# Patient Record
Sex: Male | Born: 1990
Health system: Southern US, Community
[De-identification: ages and names within clinical notes are randomized; demographics above are authoritative.]

## PROBLEM LIST (undated history)

## (undated) DIAGNOSIS — F419 Anxiety disorder, unspecified: Secondary | ICD-10-CM

## (undated) DIAGNOSIS — F329 Major depressive disorder, single episode, unspecified: Secondary | ICD-10-CM

## (undated) DIAGNOSIS — T7840XA Allergy, unspecified, initial encounter: Secondary | ICD-10-CM

## (undated) DIAGNOSIS — F32A Depression, unspecified: Secondary | ICD-10-CM

## (undated) HISTORY — DX: Anxiety disorder, unspecified: F41.9

## (undated) HISTORY — DX: Depression, unspecified: F32.A

## (undated) HISTORY — DX: Major depressive disorder, single episode, unspecified: F32.9

## (undated) HISTORY — DX: Allergy, unspecified, initial encounter: T78.40XA

---
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
## 2009-12-07 ENCOUNTER — Ambulatory Visit: Payer: Self-pay | Admitting: Family Medicine

## 2009-12-07 DIAGNOSIS — I10 Essential (primary) hypertension: Secondary | ICD-10-CM | POA: Insufficient documentation

## 2009-12-07 DIAGNOSIS — J45909 Unspecified asthma, uncomplicated: Secondary | ICD-10-CM | POA: Insufficient documentation

## 2009-12-07 DIAGNOSIS — J019 Acute sinusitis, unspecified: Secondary | ICD-10-CM | POA: Insufficient documentation

## 2009-12-07 DIAGNOSIS — F418 Other specified anxiety disorders: Secondary | ICD-10-CM | POA: Insufficient documentation

## 2010-02-26 HISTORY — PX: TYMPANOSTOMY TUBE PLACEMENT: SHX32

## 2010-03-30 NOTE — Assessment & Plan Note (Signed)
Summary: BRAND NEW PT/TO EST/URI/CJR   Vital Signs:  Patient profile:   20 year old male Weight:      136 pounds O2 Sat:      98 % Temp:     98.6 degrees F Pulse rate:   98 / minute BP sitting:   120 / 80  (left arm) Cuff size:   regular  Vitals Entered By: Pura Spice, RN (December 07, 2009 2:18 PM) CC: congestion sinus  Is Patient Diabetic? No   History of Present Illness: 20 yr old male to establish with [place] and for a 3 day hx of stuffy head, HA, PND, chest congestion, and a dry cough. No fever. He also relates that he has been mildly depressed for several months, and he has contacted Dr. Dwan Bolt, a psychologist. He will begin some therapy with him next week. 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   Preventive Screening-Counseling & Management  Alcohol-Tobacco     Smoking Status: current     Smoking Cessation Counseling: YES  Allergies (verified): No Known Drug Allergies  Past History:  Past Medical History: Asthma as a child, resolved Depression, never on meds but has had therapy in the past  elevated BP, never treated  Past Surgical History: Denies surgical history  Family History: Reviewed history and no changes required. Family History of Alcoholism/Addiction Family History of CAD Male 1st degree relative <50 Family History Depression Family History Hypertension Family History of Prostate CA 1st degree relative <50  Social History: Reviewed history and no changes required. Single Current Smoker Alcohol use-yes Smoking Status:  current  Review of Systems  The patient denies anorexia, fever, weight loss, weight gain, vision loss, decreased hearing, hoarseness, chest pain, syncope, dyspnea on exertion, peripheral edema, hemoptysis, abdominal pain, melena, hematochezia, severe indigestion/heartburn, hematuria, incontinence, genital sores, muscle weakness, suspicious skin lesions, transient  blindness, difficulty walking, unusual weight change, abnormal bleeding, enlarged lymph nodes, angioedema, breast masses, and testicular masses.    Physical Exam  General:  Well-developed,well-nourished,in no acute distress; alert,appropriate and cooperative throughout examination Head:  Normocephalic and atraumatic without obvious abnormalities. No apparent alopecia or balding. Eyes:  No corneal or conjunctival inflammation noted. EOMI. Perrla. Funduscopic exam benign, without hemorrhages, exudates or papilledema. Vision grossly normal. Ears:  External ear exam shows no significant lesions or deformities.  Otoscopic examination reveals clear canals, tympanic membranes are intact bilaterally without bulging, retraction, inflammation or discharge. Hearing is grossly normal bilaterally. Nose:  External nasal examination shows no deformity or inflammation. Nasal mucosa are pink and moist without lesions or exudates. Mouth:  Oral mucosa and oropharynx without lesions or exudates.  Teeth in good repair. Neck:  No deformities, masses, or tenderness noted. Chest Wall:  No deformities, masses, tenderness or gynecomastia noted. Lungs:  soft end expiratory wheezes Heart:  Normal rate and regular rhythm. S1 and S2 normal without gallop, murmur, click, rub or other extra sounds. Psych:  Cognition and judgment appear intact. Alert and cooperative with normal attention span and concentration. No apparent delusions, illusions, hallucinations   Impression & Recommendations:  Problem # 1:  ACUTE SINUSITIS, UNSPECIFIED (ICD-461.9)  His updated medication list for this problem includes:    Zithromax Z-pak 250 Mg Tabs (Azithromycin) .Marland Kitchen... As directed  Problem # 2:  DEPRESSION (ICD-311)  Complete Medication List: 1)  Zithromax Z-pak 250 Mg Tabs (Azithromycin) .... As directed  Patient Instructions: 1)  Tobacco  is very bad for your health and your loved ones ! You should stop smoking !  2)  Stop smoking  tips: Choose a quit date. Cut down before the quit date. Decide what you will do as a substitute when you feel the urge to smoke(gum, toothpick, exercise).  3)  Use a Zpack. 4)  follow up with therapy as above  Prescriptions: ZITHROMAX Z-PAK 250 MG TABS (AZITHROMYCIN) as directed  #1 x 0   Entered and Authorized by:   Nelwyn Salisbury MD   Signed by:   Nelwyn Salisbury MD on 12/07/2009   Method used:   Electronically to        [facility]. [phone]* (retail)       [id number]       [place], [place]  [address]       Ph: [phone]       Fax: [phone]   RxID:   [id number]

## 2010-06-14 ENCOUNTER — Ambulatory Visit (INDEPENDENT_AMBULATORY_CARE_PROVIDER_SITE_OTHER): Payer: 59 | Admitting: Family Medicine

## 2010-06-14 ENCOUNTER — Encounter: Payer: Self-pay | Admitting: Family Medicine

## 2010-06-14 VITALS — BP 120/78 | Temp 98.6°F | Ht 69.0 in | Wt 130.0 lb

## 2010-06-14 DIAGNOSIS — A088 Other specified intestinal infections: Secondary | ICD-10-CM

## 2010-06-14 DIAGNOSIS — M545 Low back pain, unspecified: Secondary | ICD-10-CM

## 2010-06-14 DIAGNOSIS — A084 Viral intestinal infection, unspecified: Secondary | ICD-10-CM

## 2010-06-14 NOTE — Progress Notes (Signed)
  Subjective:    Patient ID: Joseph Evans, male    DOB: Apr 05, 1990, 20 y.o.   MRN: [id number]  HPI Patient seen for the following  Onset this past Monday 2 days ago fever, chills, body aches, vomiting, and nonbloody diarrhea. Diarrhea seems to be slowly improving today. Still has poor appetite but no vomiting thus far today. He felt improved last night until he ate some pizza which seemed to have re- triggered his symptoms. Has not tried any Imodium. No ill contacts. He is keeping down fluids today. He has lost a few pounds this week with his illness.  Patient complains of almost 1 year of back pain mostly lower thoracic region. Initially noted after doing some lifting and twisting type injury he worked another job. No radiculopathy symptoms. Pain is somewhat intermittent. Worse with bending and lifting. Pain is mostly lower thoracic and upper lumbar region.  No numbness or weakness. No alleviating factors. No appetite or weight changes.   Review of Systems  Constitutional: Positive for fever and chills. Negative for activity change.  HENT: Negative for sore throat and trouble swallowing.   Respiratory: Negative for cough and shortness of breath.   Cardiovascular: Negative for chest pain and palpitations.  Gastrointestinal: Positive for nausea, vomiting and diarrhea. Negative for blood in stool.  Genitourinary: Negative for dysuria.  Skin: Negative for rash.  Neurological: Negative for dizziness, weakness and headaches.  Hematological: Negative for adenopathy. Does not bruise/bleed easily.       Objective:   Physical Exam  Constitutional: He is oriented to person, place, and time. He appears well-developed and well-nourished.  HENT:  Head: Normocephalic.  Right Ear: External ear normal.  Left Ear: External ear normal.  Mouth/Throat: Oropharynx is clear and moist. No oropharyngeal exudate.  Neck: Neck supple.  Cardiovascular: Normal rate and regular rhythm.   No murmur  heard. Pulmonary/Chest: Effort normal and breath sounds normal. He has no wheezes. He has no rales.  Abdominal: Soft. Bowel sounds are normal. He exhibits no distension. There is no tenderness. There is no rebound and no guarding.  Musculoskeletal: He exhibits no edema.       Straight leg raises are negative bilaterally. He has some nonspecific muscular tenderness lower thoracic region. No spinal tenderness. Full range of motion back  Lymphadenopathy:    He has no cervical adenopathy.  Neurological: He is alert and oriented to person, place, and time. He has normal reflexes.       Full strength with plantar and dorsi flexion bilaterally  Skin: No rash noted.          Assessment & Plan:  #1 probable viral gastroenteritis. Reviewed appropriate diet. Try over-the-counter Imodium as needed for diarrhea. No antinausea meds needed at this time. Were not written for today. Suspect this will continue to resolve. #2 low back pain mostly lower thoracic region. Suspect muscular. This has gone on for several months. Set up physical therapy. If no improvement to 3 weeks consider back films and followup with primary physician.

## 2010-06-14 NOTE — Patient Instructions (Signed)
Diarrhea Diarrhea can be caused by many conditions. The most common cause is a virus.  Other causes include:  Food poisoning.   Bacterial infection.   Reactions to medicine (especially antibiotics and antacids).  Most of the time diarrhea improves after 2-3 days of rest and oral fluid replacement.  Drink enough clear fluids (water, sodas, Gatorade) to prevent dehydration. Adults must drink at least 2-3 quarts daily. Solid foods and dairy products should be avoided until you improve. Then begin with bland foods such as bananas, rice, applesauce, dry toast, crackers or other starches. Avoid spicy or fatty foods, caffeine and alcohol for several days. Medicine to control cramping and diarrhea may be helpful. If you have a fever or blood in the stool, avoid these medicines. They may prolong your illness. Antibiotics can speed recovery from diarrhea due to some bacterial infections, but may cause complications.  SEEK MEDICAL CARE IF:  Your diarrhea does not get better in 3 days.   You have fever.   You have blood in the stool.   You develop vomiting.   You become more dehydrated.  Document Released: 02/12/2005 Document Re-Released: 08/26/2006 [employer] Patient Information 2011 [employer]. 

## 2010-10-23 ENCOUNTER — Ambulatory Visit (INDEPENDENT_AMBULATORY_CARE_PROVIDER_SITE_OTHER): Payer: 59 | Admitting: Family Medicine

## 2010-10-23 ENCOUNTER — Encounter: Payer: Self-pay | Admitting: Family Medicine

## 2010-10-23 VITALS — BP 118/62 | HR 65 | Temp 98.5°F | Wt 142.0 lb

## 2010-10-23 DIAGNOSIS — B079 Viral wart, unspecified: Secondary | ICD-10-CM

## 2010-10-23 NOTE — Progress Notes (Signed)
  Subjective:    Patient ID: Joseph Evans, male    DOB: 1990-06-21, 21 y.o.   MRN: [id number]  HPI Here for 2 months of a wart growing on the right 5th finger. It is not painful. OTC measures have not worked.    Review of Systems  Constitutional: Negative.        Objective:   Physical Exam  Constitutional: He appears well-developed and well-nourished.  Skin:       Flat wart on right 5th finger near the nail           Assessment & Plan:  Treated with cryotherapy. Recheck prn

## 2010-11-14 ENCOUNTER — Ambulatory Visit (INDEPENDENT_AMBULATORY_CARE_PROVIDER_SITE_OTHER): Payer: 59 | Admitting: Family Medicine

## 2010-11-14 DIAGNOSIS — J302 Other seasonal allergic rhinitis: Secondary | ICD-10-CM

## 2010-11-14 DIAGNOSIS — J309 Allergic rhinitis, unspecified: Secondary | ICD-10-CM

## 2010-11-28 ENCOUNTER — Ambulatory Visit (INDEPENDENT_AMBULATORY_CARE_PROVIDER_SITE_OTHER): Payer: 59 | Admitting: Family Medicine

## 2010-11-28 ENCOUNTER — Encounter: Payer: Self-pay | Admitting: Family Medicine

## 2010-11-28 VITALS — BP 128/76 | HR 84 | Temp 99.2°F | Wt 144.0 lb

## 2010-11-28 DIAGNOSIS — J329 Chronic sinusitis, unspecified: Secondary | ICD-10-CM

## 2010-11-28 MED ORDER — AMOXICILLIN-POT CLAVULANATE 875-125 MG PO TABS: 1.0000 | ORAL_TABLET | Freq: Two times a day (BID) | ORAL | Status: AC

## 2010-11-29 ENCOUNTER — Encounter: Payer: Self-pay | Admitting: Family Medicine

## 2010-11-29 NOTE — Progress Notes (Signed)
  Subjective:    Patient ID: Joseph Evans, male    DOB: [birth year], 20 y.o.   MRN: [id number]  HPI Here for one week of sinus pressure, PND, ST, and a dry cough. No fever   Review of Systems  Constitutional: Negative.   HENT: Positive for congestion, postnasal drip and sinus pressure.   Eyes: Negative.   Respiratory: Positive for cough.        Objective:   Physical Exam  Constitutional: He appears well-developed and well-nourished.  HENT:  Right Ear: External ear normal.  Left Ear: External ear normal.  Nose: Nose normal.  Mouth/Throat: Oropharynx is clear and moist. No oropharyngeal exudate.  Eyes: Conjunctivae are normal. Pupils are equal, round, and reactive to light.  Neck: No thyromegaly present.  Pulmonary/Chest: Effort normal and breath sounds normal.  Lymphadenopathy:    He has no cervical adenopathy.          Assessment & Plan:  Rest, fluids, Mucinex

## 2010-12-30 NOTE — Progress Notes (Signed)
System Downtime Recovery The EMR experienced a system downtime.  This downtime occurred on 11-14-2010. During this downtime paper charting was completed by the provider.  The visit was documented on paper during the downtime and will be scanned into [facility]pic, billing was completed by the [facility] Billing Department .  The visit is being closed on behalf of the provider. 

## 2011-09-24 ENCOUNTER — Ambulatory Visit (INDEPENDENT_AMBULATORY_CARE_PROVIDER_SITE_OTHER): Payer: 59 | Admitting: Family Medicine

## 2011-09-24 ENCOUNTER — Encounter: Payer: Self-pay | Admitting: Family Medicine

## 2011-09-24 VITALS — BP 126/84 | HR 90 | Temp 99.4°F | Wt 132.0 lb

## 2011-09-24 DIAGNOSIS — J329 Chronic sinusitis, unspecified: Secondary | ICD-10-CM

## 2011-09-24 MED ORDER — LEVOFLOXACIN 500 MG PO TABS: 500.0000 mg | ORAL_TABLET | Freq: Every day | ORAL | Status: AC

## 2011-09-24 NOTE — Progress Notes (Signed)
  Subjective:    Patient ID: Joseph Evans, male    DOB: December 01, 1990, 21 y.o.   MRN: [id number]  HPI Here for 3 days of fever, HA, sinus pressure, PND, and ST.    Review of Systems  Constitutional: Positive for fever.  HENT: Positive for congestion and postnasal drip.   Eyes: Negative.   Respiratory: Positive for cough.        Objective:   Physical Exam  Constitutional: He appears well-developed and well-nourished.  HENT:  Right Ear: External ear normal.  Nose: Nose normal.  Mouth/Throat: Oropharynx is clear and moist. No oropharyngeal exudate.       Left PE tube is intact   Eyes: Conjunctivae are normal.  Neck: No thyromegaly present.  Pulmonary/Chest: Effort normal and breath sounds normal.  Lymphadenopathy:    He has no cervical adenopathy.          Assessment & Plan:  Written out of work today.

## 2011-09-26 ENCOUNTER — Telehealth: Payer: Self-pay | Admitting: Family Medicine

## 2011-09-26 NOTE — Telephone Encounter (Signed)
Caller: Joseph Evans/Patient is calling with a question about Abx.The medication was written by Nelwyn Salisbury  On Levaquin since 09/24/11. States there are others around him with sx similar to what he is being treated for (sinus infection) and he questions if his illness is viral and therefore does not need to complete the Rx antibiotic - he has not started the medication.   He also asks if it is advisable for him to take the atbx since he has had several courses of medication in the past year.  Emergent sx ruled out.  Note to provider for follow up.  Medication Questions protocol used.

## 2011-09-27 NOTE — Telephone Encounter (Signed)
This is up to him. It has been 3 days now since I saw him. If he still has the same symptoms, then YES he needs to take the antibiotic. If he is feeling better, then he could hold off and see if it resolves.

## 2011-09-28 NOTE — Telephone Encounter (Signed)
I spoke with pt and he is feeling better.

## 2011-12-13 ENCOUNTER — Other Ambulatory Visit: Payer: 59

## 2011-12-20 ENCOUNTER — Encounter: Payer: 59 | Admitting: Family Medicine

## 2012-01-04 ENCOUNTER — Encounter: Payer: 59 | Admitting: Family Medicine

## 2012-01-11 ENCOUNTER — Other Ambulatory Visit: Payer: 59

## 2012-01-18 ENCOUNTER — Encounter: Payer: 59 | Admitting: Family Medicine

## 2012-03-06 ENCOUNTER — Ambulatory Visit (INDEPENDENT_AMBULATORY_CARE_PROVIDER_SITE_OTHER): Payer: 59 | Admitting: Family Medicine

## 2012-03-06 ENCOUNTER — Encounter: Payer: Self-pay | Admitting: Family Medicine

## 2012-03-06 VITALS — BP 132/78 | HR 97 | Temp 98.7°F | Wt 131.0 lb

## 2012-03-06 DIAGNOSIS — J069 Acute upper respiratory infection, unspecified: Secondary | ICD-10-CM

## 2012-03-06 NOTE — Progress Notes (Signed)
Chief Complaint  Patient presents with  . Sinus Problem    fatgiue, achy body, sinus pressure, cough, congestion x monday     HPI:  -started: 3 days ago -symptoms:nasal congestion, sore throat, cough, body aches, some sinus pressure -denies:fever, SOB, NVD, tooth pain, ear pain - strep or mono exposure -has tried: nothing -sick contacts: co-worker sick -Hx of: hx of tubes in ears   ROS: See pertinent positives and negatives per HPI.  Past Medical History  Diagnosis Date  . Asthma   . Depression     Family History  Problem Relation Age of Onset  . Heart disease Mother   . Hypertension Mother   . Mental illness Mother   . Heart disease Father   . Hypertension Father   . Mental illness Father   . Heart disease Maternal Grandmother   . Hypertension Maternal Grandmother   . Mental illness Maternal Grandmother   . Cancer Maternal Grandfather     prostate  . Heart disease Maternal Grandfather   . Hypertension Maternal Grandfather   . Mental illness Maternal Grandfather   . Heart disease Paternal Grandmother   . Hypertension Paternal Grandmother   . Mental illness Paternal Grandmother   . Cancer Paternal Grandfather     prostate  . Heart disease Paternal Grandfather   . Hypertension Paternal Grandfather   . Mental illness Paternal Grandfather   . Alcohol abuse Other     History   Social History  . Marital Status: Single    Spouse Name: N/A    Number of Children: N/A  . Years of Education: N/A   Social History Main Topics  . Smoking status: Current Every Day Smoker -- 0.5 packs/day for 12 years    Types: Cigarettes  . Smokeless tobacco: Never Used  . Alcohol Use: 3.5 oz/week    7 drink(s) per week  . Drug Use: No  . Sexually Active: None   Other Topics Concern  . None   Social History Narrative   Occupation:  [employer]SingleAlcohol use: yesCurrent SmokerRecreational Drug use:  "smokes weed occ"    Current outpatient prescriptions:fluticasone  (FLONASE) 50 MCG/ACT nasal spray, Place 2 sprays into the nose daily.  , Disp: , Rfl:   EXAM:  Filed Vitals:   03/06/12 1502  BP: 132/78  Pulse: 97  Temp: 98.7 F (37.1 C)    There is no height on file to calculate BMI.  GENERAL: vitals reviewed and listed above, alert, oriented, appears well hydrated and in no acute distress  HEENT: atraumatic, conjunttiva clear, no obvious abnormalities on inspection of external nose and ears, tube in place L TM, both ear canals normal, TMs clear, clear nasal congestion, mild erythema of post oropharynx with PND  NECK: no obvious masses on inspection  LUNGS: clear to auscultation bilaterally, no wheezes, rales or rhonchi, good air movement  CV: HRRR, no peripheral edema  MS: moves all extremities without noticeable abnormality  PSYCH: pleasant and cooperative, no obvious depression or anxiety  ASSESSMENT AND PLAN:  Discussed the following assessment and plan:  1. Upper respiratory infection    -likely viral, supportive care and return precartions -Patient advised to return or notify a doctor immediately if symptoms worsen or persist or new concerns arise.  Patient Instructions  INSTRUCTIONS FOR UPPER RESPIRATORY INFECTION:  -plenty of rest and fluids  -nasal saline wash 2-3 times daily (use prepackaged nasal saline or bottled/distilled water if making your own)   -can use sinex nasal spray for  drainage and nasal congestion - but do NOT use longer then 3-4 days  -can use tylenol or ibuprofen as directed for aches and sorethroat  -in the winter time, using a humidifier at night is helpful (please follow cleaning instructions)  -if you are taking a cough medication - use only as directed, may also try a teaspoon of honey to coat the throat and throat lozenges  -for sore throat, salt water gargles can help  -follow up if you have fevers, facial pain, tooth pain, difficulty breathing or are worsening or not getting better in 5-7  days      KIM, Dahlia Client R.

## 2012-03-06 NOTE — Patient Instructions (Signed)

## 2012-11-08 ENCOUNTER — Ambulatory Visit (INDEPENDENT_AMBULATORY_CARE_PROVIDER_SITE_OTHER): Payer: 59 | Admitting: Physician Assistant

## 2012-11-08 VITALS — BP 120/80 | HR 83 | Temp 99.3°F | Resp 16 | Ht 71.0 in | Wt 135.0 lb

## 2012-11-08 DIAGNOSIS — J069 Acute upper respiratory infection, unspecified: Secondary | ICD-10-CM

## 2012-11-08 MED ORDER — PSEUDOEPHEDRINE HCL 60 MG PO TABS: 60.0000 mg | ORAL_TABLET | Freq: Four times a day (QID) | ORAL | Status: DC | PRN

## 2012-11-08 MED ORDER — CETIRIZINE HCL 10 MG PO TABS: 10.0000 mg | ORAL_TABLET | Freq: Every day | ORAL | Status: DC

## 2012-11-08 MED ORDER — IPRATROPIUM BROMIDE 0.03 % NA SOLN: 2.0000 | Freq: Two times a day (BID) | NASAL | Status: DC

## 2012-11-08 MED ORDER — BENZONATATE 100 MG PO CAPS: 100.0000 mg | ORAL_CAPSULE | Freq: Three times a day (TID) | ORAL | Status: DC | PRN

## 2012-11-08 NOTE — Patient Instructions (Addendum)
Take the cetirizine once daily - this will help dry up nasal congestion.  Atrovent (ipratropium) nasal spray 2-3 times per day to help with runny nose and post-nasal drainage.  Pseudoephedrine every 6 hours for the next 2-3 days to relieve congestion  Tessalon Perles (benzonatate) every 8 hours if needed for cough.  Drink plenty of fluids (water is best!) and get plenty of rest.  Let us know if any symptoms are worsening or not improving.   Upper Respiratory Infection, Adult An upper respiratory infection (URI) is also sometimes known as the common cold. The upper respiratory tract includes the nose, sinuses, throat, trachea, and bronchi. Bronchi are the airways leading to the lungs. Most people improve within 1 week, but symptoms can last up to 2 weeks. A residual cough may last even longer.  CAUSES Many different viruses can infect the tissues lining the upper respiratory tract. The tissues become irritated and inflamed and often become very moist. Mucus production is also common. A cold is contagious. You can easily spread the virus to others by oral contact. This includes kissing, sharing a glass, coughing, or sneezing. Touching your mouth or nose and then touching a surface, which is then touched by another person, can also spread the virus. SYMPTOMS  Symptoms typically develop 1 to 3 days after you come in contact with a cold virus. Symptoms vary from person to person. They may include:  Runny nose.  Sneezing.  Nasal congestion.  Sinus irritation.  Sore throat.  Loss of voice (laryngitis).  Cough.  Fatigue.  Muscle aches.  Loss of appetite.  Headache.  Low-grade fever. DIAGNOSIS  You might diagnose your own cold based on familiar symptoms, since most people get a cold 2 to 3 times a year. Your caregiver can confirm this based on your exam. Most importantly, your caregiver can check that your symptoms are not due to another disease such as strep throat, sinusitis,  pneumonia, asthma, or epiglottitis. Blood tests, throat tests, and X-rays are not necessary to diagnose a common cold, but they may sometimes be helpful in excluding other more serious diseases. Your caregiver will decide if any further tests are required. RISKS AND COMPLICATIONS  You may be at risk for a more severe case of the common cold if you smoke cigarettes, have chronic heart disease (such as heart failure) or lung disease (such as asthma), or if you have a weakened immune system. The very young and very old are also at risk for more serious infections. Bacterial sinusitis, middle ear infections, and bacterial pneumonia can complicate the common cold. The common cold can worsen asthma and chronic obstructive pulmonary disease (COPD). Sometimes, these complications can require emergency medical care and may be life-threatening. PREVENTION  The best way to protect against getting a cold is to practice good hygiene. Avoid oral or hand contact with people with cold symptoms. Wash your hands often if contact occurs. There is no clear evidence that vitamin C, vitamin E, echinacea, or exercise reduces the chance of developing a cold. However, it is always recommended to get plenty of rest and practice good nutrition. TREATMENT  Treatment is directed at relieving symptoms. There is no cure. Antibiotics are not effective, because the infection is caused by a virus, not by bacteria. Treatment may include:  Increased fluid intake. Sports drinks offer valuable electrolytes, sugars, and fluids.  Breathing heated mist or steam (vaporizer or shower).  Eating chicken soup or other clear broths, and maintaining good nutrition.  Getting plenty of  rest.  Using gargles or lozenges for comfort.  Controlling fevers with ibuprofen or acetaminophen as directed by your caregiver.  Increasing usage of your inhaler if you have asthma. Zinc gel and zinc lozenges, taken in the first 24 hours of the common cold, can  shorten the duration and lessen the severity of symptoms. Pain medicines may help with fever, muscle aches, and throat pain. A variety of non-prescription medicines are available to treat congestion and runny nose. Your caregiver can make recommendations and may suggest nasal or lung inhalers for other symptoms.  HOME CARE INSTRUCTIONS   Only take over-the-counter or prescription medicines for pain, discomfort, or fever as directed by your caregiver.  Use a warm mist humidifier or inhale steam from a shower to increase air moisture. This may keep secretions moist and make it easier to breathe.  Drink enough water and fluids to keep your urine clear or pale yellow.  Rest as needed.  Return to work when your temperature has returned to normal or as your caregiver advises. You may need to stay home longer to avoid infecting others. You can also use a face mask and careful hand washing to prevent spread of the virus. SEEK MEDICAL CARE IF:   After the first few days, you feel you are getting worse rather than better.  You need your caregiver's advice about medicines to control symptoms.  You develop chills, worsening shortness of breath, or brown or red sputum. These may be signs of pneumonia.  You develop yellow or brown nasal discharge or pain in the face, especially when you bend forward. These may be signs of sinusitis.  You develop a fever, swollen neck glands, pain with swallowing, or white areas in the back of your throat. These may be signs of strep throat. SEEK IMMEDIATE MEDICAL CARE IF:   You have a fever.  You develop severe or persistent headache, ear pain, sinus pain, or chest pain.  You develop wheezing, a prolonged cough, cough up blood, or have a change in your usual mucus (if you have chronic lung disease).  You develop sore muscles or a stiff neck. Document Released: 08/08/2000 Document Revised: 05/07/2011 Document Reviewed: 06/16/2010 [facility] Patient Information 2014  [place], [place].

## 2012-11-08 NOTE — Progress Notes (Signed)
  Subjective:    Patient ID: Joseph Evans, male    DOB: 24-Nov-1990, 22 y.o.   MRN: [id number]   HPI   Mr. Joseph Evans is a pleasant 22 yr old male here with concern for illness.  Reports he is "painfully stuffed up", "stuffed up beyond belief."  Nasal drainage - "viscous green mucus."  +sore throat.  Subjective fever and chills though hasn't taken his temp.  +coughing, +sneezing.  All symptoms started yesterday, worse today.  One episode of emesis today - denies nausea. Girlfriend had similar symptoms recently, though not as bad.  He has not used anything for symptoms, has been trying to increase fluid intake.   Review of Systems  Constitutional: Positive for fever (subjective) and chills (subjective).  HENT: Positive for ear pain, congestion, sore throat, rhinorrhea, sneezing and postnasal drip.   Respiratory: Positive for cough. Negative for shortness of breath and wheezing.   Cardiovascular: Negative.   Gastrointestinal: Positive for vomiting. Negative for nausea and abdominal distention.  Musculoskeletal: Negative.   Skin: Negative.   Neurological: Negative.        Objective:   Physical Exam  Vitals reviewed. Constitutional: He is oriented to person, place, and time. He appears well-developed and well-nourished. No distress.  HENT:  Head: Normocephalic and atraumatic.  Right Ear: Tympanic membrane and ear canal normal.  Left Ear: Tympanic membrane and ear canal normal.  Nose: Mucosal edema and rhinorrhea present. Right sinus exhibits no maxillary sinus tenderness and no frontal sinus tenderness. Left sinus exhibits no maxillary sinus tenderness and no frontal sinus tenderness.  Mouth/Throat: Uvula is midline, oropharynx is clear and moist and mucous membranes are normal.  Eyes: Conjunctivae are normal. No scleral icterus.  Neck: Neck supple.  Cardiovascular: Normal rate, regular rhythm and normal heart sounds.   Pulmonary/Chest: Effort normal and breath sounds normal. He has no  wheezes. He has no rales.  Lymphadenopathy:    He has no cervical adenopathy.  Neurological: He is alert and oriented to person, place, and time.  Skin: Skin is warm and dry.  Psychiatric: He has a normal mood and affect. His behavior is normal.        Assessment & Plan:  Viral URI - Plan: ipratropium (ATROVENT) 0.03 % nasal spray, benzonatate (TESSALON) 100 MG capsule, cetirizine (ZYRTEC) 10 MG tablet, pseudoephedrine (SUDAFED) 60 MG tablet   [name] is a 22 yr old male here with 2 days of URI symptoms.  Suspect viral etiology.  He is afebrile, VSS, lungs CTA.  Will treat symptoms with Atrovent, Tessalon, Zyrtec, Sudafed.  Push fluids, rest.  Work note provided.  RTC if worsening or not improving.

## 2013-11-04 ENCOUNTER — Ambulatory Visit (INDEPENDENT_AMBULATORY_CARE_PROVIDER_SITE_OTHER): Payer: 59 | Admitting: Podiatrist

## 2013-11-04 ENCOUNTER — Encounter: Payer: Self-pay | Admitting: Podiatrist

## 2013-11-04 DIAGNOSIS — L03039 Cellulitis of unspecified toe: Secondary | ICD-10-CM

## 2013-11-04 NOTE — Patient Instructions (Signed)
Ingrown Toenail An ingrown toenail occurs when the sharp edge of a toenail grows into the skin of the groove beside the nail (lateral nail groove), causing pain. Ingrown toenails most commonly occur on the first (big) toe. If left untreated, an ingrown toenail may become inflamed and infected. The infection can even spread throughout the toe. SYMPTOMS   Pain, centered on the nail groove.  Tenderness and inflammation.  Pus drainage (occasionally).  Signs of infection: redness, swelling, pain, warm to the touch. CAUSES  Ingrown toenails are caused when the toenail grows into the neighboring fleshy nail fold. This may be the result of poor nail trimming or pressure from shoes.  RISK INCREASES WITH:   Curved nail formations.  Clipping toenails too far on the sides, which allows tissue to grow over the nail.  Poorly fitted shoes, especially those that press down on the toenails.  Sports that require sudden stops, causing the toe to jam into the shoe. PREVENTION   Trim toenails properly, making sure the sides are not clipped too short.  Wear properly fitted shoes.  Avoid excessive pressure on the toenails. PROGNOSIS  Ingrown toenails are usually curable within 1 week, depending on the presence or absence of an infection.  RELATED COMPLICATIONS   Chronic infection, that cannot be cured without surgery.  Spread of infection throughout the toe, or even the bone. TREATMENT  Treatment first involves resting from aggravating activities, wearing shoes that do not place pressure on the toenails, antibiotics (if infected), and soaking the foot in warm water (with or without Epsom salt). After the foot has been soaked, you may be able to gently lift the nail from the fleshy tissue, and place a bit of cotton ball under the nail, easing the pressure. If you have been prescribed antibiotics, expect relief to begin up to 48 hours after taking the medicine. Symptoms usually go away within 1 week. For  people who suffer from persistent (chronic) ingrown toenails, surgery may be advised. MEDICATION   If pain medicine is needed, nonsteroidal anti-inflammatory medicines (aspirin and ibuprofen), or other minor pain relievers (acetaminophen), are often advised.  Do not take pain medicine for 7 days before surgery.  Stronger pain relievers may be prescribed, if your caregiver thinks they are needed. Use only as directed and only as much as you need.  Antibiotics may be prescribed to fight infection. Take as directed by your caregiver. Finish the entire prescription, even if you start to feel better before you finish it. SOAKS AND COLD THERAPY   Soak the toe (or whole foot) for 20 minutes, twice a day, in a gallon of warm water. You may add 2 tablespoons of Epsom salts or a mild detergent.  Cold treatment (icing) should be applied for 10 to 15 minutes every 2 to 3 hours for inflammation and pain, and immediately after activity that aggravates your symptoms. Use ice packs or an ice massage. SEEK MEDICAL CARE IF:   Symptoms get worse or do not improve in 48 hours, despite treatment.  You develop fever, increased pain and swelling, or signs of infection (pain, redness, tenderness, swelling, warmth) in the toe, after surgery.  New, unexplained symptoms develop. (Drugs used in treatment may produce side effects.) Document Released: 02/12/2005 Document Revised: 05/07/2011 Document Reviewed: 05/27/2008 Exit[employer] Patient Information 2015 [employer]. This information is not intended to replace advice given to you by your health care provider. Make sure you discuss any questions you have with your health care provider.  

## 2013-11-04 NOTE — Progress Notes (Signed)
   Subjective:    Patient ID: Joseph Evans, male    DOB: August 23, 1990, 23 y.o.   MRN: [id number]  HPI  PT STATED INJURED LT FOOT GREAT TOENAIL IS BEEN HURTING FOR 1 MONTH. THE TOENAIL IS BEEN THE SAME. THE TOE GET AGGRAVATED BY PRESSURE. TRIED NO TREATMENT.    Review of Systems  All other systems reviewed and are negative.      Objective:   Physical Exam Patient is awake, alert, and oriented x 3.  In no acute distress.  Vascular status is intact with palpable pedal pulses at 2/4 DP and PT bilateral and capillary refill time within normal limits. Neurological sensation is also intact bilaterally via Semmes Weinstein monofilament at 5/5 sites. Light touch, vibratory sensation, Achilles tendon reflex is intact. Dermatological exam reveals skin color, turger and texture as normal. No open lesions present.  Musculature intact with dorsiflexion, plantarflexion, inversion, eversion.  left hallux nail distal tuft is grown into the distal tip of the toe.  Mild redness is present and no other signs of infection are present.       Assessment & Plan:  Contusion, paronychia left first  Plan:  Recommended a nonpermanent avulsion of the nail plate in order to allow a new nail to grow back out so he can train it not go into the skin. He would like to consider having the procedure and will be rescheduled for next week at his convenience. In the meantime he will try soaking and taping down the distal tip of the toe in order to take pressure off of the nail.

## 2013-11-11 ENCOUNTER — Ambulatory Visit (HOSPITAL_COMMUNITY)
Admission: RE | Admit: 2013-11-11 | Discharge: 2013-11-11 | Disposition: A | Discharge status: Home / Self Care | Payer: Worker's Compensation | Source: Ambulatory Visit | Attending: Family Medicine | Admitting: Family Medicine

## 2013-11-11 ENCOUNTER — Ambulatory Visit: Payer: 59

## 2013-11-11 ENCOUNTER — Other Ambulatory Visit: Payer: Self-pay | Admitting: Family Medicine

## 2013-11-11 DIAGNOSIS — S0093XA Contusion of unspecified part of head, initial encounter: Secondary | ICD-10-CM

## 2013-11-11 DIAGNOSIS — S0003XA Contusion of scalp, initial encounter: Secondary | ICD-10-CM | POA: Insufficient documentation

## 2013-11-11 DIAGNOSIS — X58XXXA Exposure to other specified factors, initial encounter: Secondary | ICD-10-CM | POA: Diagnosis not present

## 2013-11-11 DIAGNOSIS — R51 Headache: Secondary | ICD-10-CM | POA: Insufficient documentation

## 2013-11-11 DIAGNOSIS — R42 Dizziness and giddiness: Secondary | ICD-10-CM | POA: Diagnosis not present

## 2013-11-11 DIAGNOSIS — S1093XA Contusion of unspecified part of neck, initial encounter: Secondary | ICD-10-CM | POA: Diagnosis present

## 2013-11-11 DIAGNOSIS — S0083XA Contusion of other part of head, initial encounter: Principal | ICD-10-CM | POA: Insufficient documentation

## 2013-11-13 ENCOUNTER — Telehealth: Payer: Self-pay | Admitting: *Deleted

## 2013-11-13 ENCOUNTER — Ambulatory Visit: Payer: 59 | Admitting: Podiatrist

## 2013-11-13 NOTE — Telephone Encounter (Signed)
I had an appointment today at 3pm and I'm actually coming next Friday at 2:30pm.  I just want to verify, I'm actually moving into a new place Friday as well.  I just want to make sure I will be able to walk and take care of normal functions.  Give me a call.  I'd definitely appreciate a call back thank you.  I called and informed him that his toe will be sore, may get irritated from shoe while moving due to the absence of the toenail.  Toenail will not be there to protect it.  He stated, "I see what you mean."  He asked if he could do it the week after but he has to detail some cars.  I explained to him that depends upon him due to the same reason.  He stated he would suck it up.  I transferred him to a scheduler to reschedule his appointment.

## 2013-11-20 ENCOUNTER — Ambulatory Visit: Payer: 59 | Admitting: Podiatrist

## 2013-11-25 ENCOUNTER — Ambulatory Visit: Payer: 59 | Admitting: Podiatrist

## 2013-12-18 ENCOUNTER — Ambulatory Visit: Payer: 59 | Admitting: Podiatrist

## 2013-12-19 ENCOUNTER — Ambulatory Visit (INDEPENDENT_AMBULATORY_CARE_PROVIDER_SITE_OTHER): Payer: 59 | Admitting: Podiatry

## 2013-12-19 ENCOUNTER — Encounter: Payer: Self-pay | Admitting: Podiatry

## 2013-12-19 VITALS — BP 129/80 | HR 57 | Resp 18

## 2013-12-19 DIAGNOSIS — L601 Onycholysis: Secondary | ICD-10-CM

## 2013-12-19 DIAGNOSIS — L600 Ingrowing nail: Secondary | ICD-10-CM

## 2013-12-19 MED ORDER — CEPHALEXIN 500 MG PO CAPS: 500.0000 mg | ORAL_CAPSULE | Freq: Three times a day (TID) | ORAL | Status: DC

## 2013-12-19 NOTE — Progress Notes (Signed)
   Subjective:    Patient ID: Joseph Evans, male    DOB: 01/14/1991, 23 y.o.   MRN: [id number]  HPI  Ascending, 23 year old male, returns to the office today with complaints of left leg toe nail pain. He states that he hit the nail approximately 2 months ago and since has had pain to the nail. He has noticed some cracking of the nail as well as ingrowing in the nail borders. Pain is aggravated with pressure. He has noticed some erythema around the nail. Denies any drainage. Since last appointment he continues to have pain. No other complaints at this time. No acute changes since last appointment.    Review of Systems  All other systems reviewed and are negative.      Objective:   Physical Exam AAO x3, NAD DP/PT pulses palpable bilaterally, CRT less than 3 seconds Protective sensation intact with Simms Weinstein monofilament, vibratory sensation intact, Achilles tendon reflex intact Left hallux nail with tenderness to palpation over both the medial and lateral nail borders. There is mild erythema around both nail borders. There is evidence of splitting along the central aspect of the nail. There is palpation directly over the nail. MMT 5/5, ROM WNL No open lesions. No calf pain, swelling, warmth, erythema        Assessment & Plan:  23 year old male with left hallux toenail pain, ingrowing of nail -Conservative versus surgical treatment discussed including alternatives, risks, complications. -At this time patient less than proceed with total nail avulsion due to medial and lateral nail border pain as well as splitting on the nail. Risks, cases discussed for which the patient understands and wishes to proceed with the procedure. Under standard conditions a total of 2.5 mL of a one-to-one mixture of 2% lidocaine plain and 0.5% Marcaine plain was infiltrated in a hallux block fashion. Once anesthetized the skin was then prepped in a sterile fashion. A tourniquet was then applied. Next the  hallux nail was then removed in total. The area was irrigated. Silvadene was applied followed by dry sterile dressing. After application of the dressing tourniquet was removed and there was noted to be an immediate capillary refill time noted to the digit. Patient tolerated the procedure well without complications. -Postprocedure instructions discussed the patient for which he verbally understood. -Prescribed Keflex. -Monitor for clinical signs or symptoms of infection and instructed to call the office immediately if any occur directed to go directly to the emergency room. -Follow-up in 1 week or sooner if any problems are to arise or any change in symptoms. Patient is able to follow up next week and I'll see have the next available Saturday appointment. He is to call sooner if there are any problems to be seen during the week.

## 2013-12-19 NOTE — Patient Instructions (Signed)

## 2013-12-21 ENCOUNTER — Encounter: Payer: Self-pay | Admitting: Podiatry

## 2014-01-09 ENCOUNTER — Ambulatory Visit: Payer: 59 | Admitting: Podiatry

## 2014-03-05 ENCOUNTER — Telehealth: Payer: Self-pay

## 2014-03-05 NOTE — Telephone Encounter (Signed)
[name] left voicemail at 9:11am asking for a call back regarding patient. Did not give any details. Called her back at [phone] and left voicemail.

## 2014-03-05 NOTE — Telephone Encounter (Signed)
Lafonda MossesDiana returned call. States they are trying to file an appeal for a CT scan patient had done in September 2015. This was related to his workers comp visit. Notes faxed to her at [phone]. Informed her that if she needed additional information to contact Suzy in the worker's comp department.

## 2014-03-11 NOTE — Telephone Encounter (Signed)
Faxed notes several times to [phone] and none of the faxes went thru. Chart will be filed back. She is already instructed to contact [facility] dept for follow up questions.

## 2014-05-10 ENCOUNTER — Telehealth: Payer: Self-pay | Admitting: Family Medicine

## 2014-05-10 ENCOUNTER — Ambulatory Visit (INDEPENDENT_AMBULATORY_CARE_PROVIDER_SITE_OTHER): Payer: 59 | Admitting: Family Medicine

## 2014-05-10 ENCOUNTER — Encounter: Payer: Self-pay | Admitting: Family Medicine

## 2014-05-10 VITALS — BP 138/77 | HR 76 | Temp 98.7°F | Ht 71.0 in | Wt 160.0 lb

## 2014-05-10 DIAGNOSIS — Z0000 Encounter for general adult medical examination without abnormal findings: Secondary | ICD-10-CM

## 2014-05-10 LAB — CBC WITH DIFFERENTIAL/PLATELET
Basophils Absolute: 0 10*3/uL (ref 0.0–0.1)
Basophils Relative: 0.7 % (ref 0.0–3.0)
EOS PCT: 2.4 % (ref 0.0–5.0)
Eosinophils Absolute: 0.2 10*3/uL (ref 0.0–0.7)
HEMATOCRIT: 40.5 % (ref 39.0–52.0)
Hemoglobin: 13.8 g/dL (ref 13.0–17.0)
LYMPHS ABS: 2.2 10*3/uL (ref 0.7–4.0)
Lymphocytes Relative: 32.6 % (ref 12.0–46.0)
MCHC: 34.1 g/dL (ref 30.0–36.0)
MCV: 87 fl (ref 78.0–100.0)
MONOS PCT: 6.7 % (ref 3.0–12.0)
Monocytes Absolute: 0.5 10*3/uL (ref 0.1–1.0)
NEUTROS PCT: 57.6 % (ref 43.0–77.0)
Neutro Abs: 3.9 10*3/uL (ref 1.4–7.7)
PLATELETS: 298 10*3/uL (ref 150.0–400.0)
RBC: 4.66 Mil/uL (ref 4.22–5.81)
RDW: 13.5 % (ref 11.5–15.5)
WBC: 6.7 10*3/uL (ref 4.0–10.5)

## 2014-05-10 LAB — BASIC METABOLIC PANEL
BUN: 15 mg/dL (ref 6–23)
CHLORIDE: 106 meq/L (ref 96–112)
CO2: 28 meq/L (ref 19–32)
Calcium: 9.2 mg/dL (ref 8.4–10.5)
Creatinine, Ser: 0.83 mg/dL (ref 0.40–1.50)
GFR: 120.44 mL/min (ref >60.00)
GLUCOSE: 88 mg/dL (ref 70–99)
POTASSIUM: 4.2 meq/L (ref 3.5–5.1)
Sodium: 139 mEq/L (ref 135–145)

## 2014-05-10 LAB — HEPATIC FUNCTION PANEL
ALK PHOS: 58 U/L (ref 39–117)
ALT: 25 U/L (ref 0–53)
AST: 22 U/L (ref 0–37)
Albumin: 4.4 g/dL (ref 3.5–5.2)
BILIRUBIN TOTAL: 0.3 mg/dL (ref 0.2–1.2)
Bilirubin, Direct: 0.1 mg/dL (ref 0.0–0.3)
Total Protein: 6.9 g/dL (ref 6.0–8.3)

## 2014-05-10 LAB — LIPID PANEL
CHOL/HDL RATIO: 3
Cholesterol: 162 mg/dL (ref 0–200)
HDL: 52.6 mg/dL (ref >39.00)
LDL CALC: 97 mg/dL (ref 0–99)
NONHDL: 109.4
TRIGLYCERIDES: 62 mg/dL (ref 0.0–149.0)
VLDL: 12.4 mg/dL (ref 0.0–40.0)

## 2014-05-10 LAB — POCT URINALYSIS DIPSTICK
BILIRUBIN UA: NEGATIVE
GLUCOSE UA: NEGATIVE
Ketones, UA: NEGATIVE
LEUKOCYTES UA: NEGATIVE
NITRITE UA: NEGATIVE
PH UA: 6
Protein, UA: NEGATIVE
RBC UA: NEGATIVE
SPEC GRAV UA: 1.015
Urobilinogen, UA: 0.2

## 2014-05-10 LAB — TSH: TSH: 1.01 u[IU]/mL (ref 0.35–4.50)

## 2014-05-10 NOTE — Telephone Encounter (Signed)
emmi emailed °

## 2014-05-10 NOTE — Progress Notes (Signed)
Pre visit review using our clinic review tool, if applicable. No additional management support is needed unless otherwise documented below in the visit note. 

## 2014-05-10 NOTE — Progress Notes (Signed)
   Subjective:    Patient ID: Joseph Evans, male    DOB: 1991-01-20, 24 y.o.   MRN: [id number]  HPI 24 yr old male for a cpx. He feels well.    Review of Systems  Constitutional: Negative.   HENT: Negative.   Eyes: Negative.   Respiratory: Negative.   Cardiovascular: Negative.   Gastrointestinal: Negative.   Genitourinary: Negative.   Musculoskeletal: Negative.   Skin: Negative.   Neurological: Negative.   Psychiatric/Behavioral: Negative.        Objective:   Physical Exam  Constitutional: He is oriented to person, place, and time. He appears well-developed and well-nourished. No distress.  HENT:  Head: Normocephalic and atraumatic.  Right Ear: External ear normal.  Left Ear: External ear normal.  Nose: Nose normal.  Mouth/Throat: Oropharynx is clear and moist. No oropharyngeal exudate.  Eyes: Conjunctivae and EOM are normal. Pupils are equal, round, and reactive to light. Right eye exhibits no discharge. Left eye exhibits no discharge. No scleral icterus.  Neck: Neck supple. No JVD present. No tracheal deviation present. No thyromegaly present.  Cardiovascular: Normal rate, regular rhythm, normal heart sounds and intact distal pulses.  Exam reveals no gallop and no friction rub.   No murmur heard. Pulmonary/Chest: Effort normal and breath sounds normal. No respiratory distress. He has no wheezes. He has no rales. He exhibits no tenderness.  Abdominal: Soft. Bowel sounds are normal. He exhibits no distension and no mass. There is no tenderness. There is no rebound and no guarding.  Genitourinary: Rectum normal, prostate normal and penis normal. Guaiac negative stool. No penile tenderness.  Musculoskeletal: Normal range of motion. He exhibits no edema or tenderness.  Lymphadenopathy:    He has no cervical adenopathy.  Neurological: He is alert and oriented to person, place, and time. He has normal reflexes. No cranial nerve deficit. He exhibits normal muscle tone.  Coordination normal.  Skin: Skin is warm and dry. No rash noted. He is not diaphoretic. No erythema. No pallor.  Psychiatric: He has a normal mood and affect. His behavior is normal. Judgment and thought content normal.          Assessment & Plan:  Well exam. Get fasting labs today.

## 2015-01-14 ENCOUNTER — Ambulatory Visit (INDEPENDENT_AMBULATORY_CARE_PROVIDER_SITE_OTHER): Payer: 59 | Admitting: Emergency Medicine

## 2015-01-14 VITALS — BP 126/80 | HR 69 | Temp 97.6°F | Resp 16 | Ht 69.0 in | Wt 146.0 lb

## 2015-01-14 DIAGNOSIS — R040 Epistaxis: Secondary | ICD-10-CM

## 2015-01-14 NOTE — Progress Notes (Signed)
Subjective:  Patient ID: Joseph Evans, male    DOB: [birth year]  Age: 24 y.o. MRN: [id number]  CC: Nose bleed and clots in throat   HPI [name] presents  patient describes recurrent nosebleed since Wednesday initially the nosebleed was anterior and after that it's been draining back throat. He has no history of injury or use of nasal sprays or other complaints. He has no fever chills cough coryza and nasal discharge or postnasal drip. No sore throat.  History Joseph Evans has a past medical history of Depression; Asthma; Allergy; and Anxiety.   He has past surgical history that includes Tympanostomy tube placement (2012).   His  family history includes Alcohol abuse in his other; Cancer in his maternal grandfather and paternal grandfather; Heart disease in his father, maternal grandfather, maternal grandmother, mother, paternal grandfather, and paternal grandmother; Hypertension in his father, maternal grandfather, maternal grandmother, mother, paternal grandfather, and paternal grandmother; Mental illness in his father, maternal grandfather, maternal grandmother, mother, paternal grandfather, and paternal grandmother.  He   reports that he has been smoking Cigarettes.  He has a 6 pack-year smoking history. His smokeless tobacco use includes Chew. He reports that he drinks alcohol. He reports that he does not use illicit drugs.  No outpatient prescriptions prior to visit.   No facility-administered medications prior to visit.    Social History   Social History  . Marital Status: Single    Spouse Name: N/A  . Number of Children: N/A  . Years of Education: N/A   Social History Main Topics  . Smoking status: Current Every Day Smoker -- 0.50 packs/day for 12 years    Types: Cigarettes  . Smokeless tobacco: Current User    Types: Chew  . Alcohol Use: 0.0 oz/week    0 Standard drinks or equivalent per week     Comment: couple beers each day  . Drug Use: No  . Sexual Activity:  Not Asked   Other Topics Concern  . None   Social History Narrative   Occupation:  Insurance risk surveyor   Single   Alcohol use: yes   Current Smoker   Recreational Drug use:  "smokes weed occ"     Review of Systems  Constitutional: Negative for fever, chills and appetite change.  HENT: Negative for congestion, ear pain, postnasal drip, sinus pressure and sore throat.   Eyes: Negative for pain and redness.  Respiratory: Negative for cough, shortness of breath and wheezing.   Cardiovascular: Negative for leg swelling.  Gastrointestinal: Negative for nausea, vomiting, abdominal pain, diarrhea, constipation and blood in stool.  Endocrine: Negative for polyuria.  Genitourinary: Negative for dysuria, urgency, frequency and flank pain.  Musculoskeletal: Negative for gait problem.  Skin: Negative for rash.  Neurological: Negative for weakness and headaches.  Psychiatric/Behavioral: Negative for confusion and decreased concentration. The patient is not nervous/anxious.     Objective:  BP 126/80 mmHg  Pulse 69  Temp(Src) 97.6 F (36.4 C) (Oral)  Resp 16  Ht  (1.753 m)  Wt 146 lb (66.225 kg)  BMI 21.55 kg/m2  SpO2 98%  Physical Exam  Constitutional: He is oriented to person, place, and time. He appears well-developed and well-nourished.  HENT:  Head: Normocephalic and atraumatic.  Eyes: Conjunctivae are normal. Pupils are equal, round, and reactive to light.  Pulmonary/Chest: Effort normal.  Musculoskeletal: He exhibits no edema.  Neurological: He is alert and oriented to person, place, and time.  Skin: Skin is dry.  Psychiatric:  He has a normal mood and affect. His behavior is normal. Thought content normal.   On intranasal exam is no evidence of blood bleeding or clots.   Assessment & Plan:   Joseph Evans was seen today for nose bleed and clots in throat.  Diagnoses and all orders for this visit:  Frequent nosebleeds -     Ambulatory referral to ENT   Mr. Alvina FilbertCoates  does not currently have medications on file.  No orders of the defined types were placed in this encounter.    Appropriate red flag conditions were discussed with the patient as well as actions that should be taken.  Patient expressed his understanding.  Follow-up: Return if symptoms worsen or fail to improve.  Carmelina DaneAnderson, Orine Goga S, MD

## 2015-01-14 NOTE — Patient Instructions (Addendum)
YOU HAVE AN APPT WITH DR. BATES 01/17/15 @ 9 AM AT [facility]    Nosebleed Nosebleeds are common. They are due to a crack in the inside lining of your nose (mucous membrane) or from a small blood vessel that starts to bleed. Nosebleeds can be caused by many conditions, such as injury, infections, dry mucous membranes or dry climate, medicines, nose picking, and home heating and cooling systems. Most nosebleeds come from blood vessels in the front of your nose. HOME CARE INSTRUCTIONS   Try controlling your nosebleed by pinching your nostrils gently and continuously for at least 10 minutes.  Avoid blowing or sniffing your nose for a number of hours after having a nosebleed.  Do not put gauze inside your nose yourself. If your nose was packed by your health care provider, try to maintain the pack inside of your nose until your health care provider removes it.  If a gauze pack was used and it starts to fall out, gently replace it or cut off the end of it.  If a balloon catheter was used to pack your nose, do not cut or remove it unless your health care provider has instructed you to do that.  Avoid lying down while you are having a nosebleed. Sit up and lean forward.  Use a nasal spray decongestant to help with a nosebleed as directed by your health care provider.  Do not use petroleum jelly or mineral oil in your nose. These can drip into your lungs.  Maintain humidity in your home by using less air conditioning or by using a humidifier.  Aspirinand blood thinners make bleeding more likely. If you are prescribed these medicines and you suffer from nosebleeds, ask your health care provider if you should stop taking the medicines or adjust the dose. Do not stop medicines unless directed by your health care provider  Resume your normal activities as you are able, but avoid straining, lifting, or bending at the waist for several days.  If your nosebleed was caused by dry mucous membranes,  use over-the-counter saline nasal spray or gel. This will keep the mucous membranes moist and allow them to heal. If you must use a lubricant, choose the water-soluble variety. Use it only sparingly, and do not use it within several hours of lying down.  Keep all follow-up visits as directed by your health care provider. This is important. SEEK MEDICAL CARE IF:  You have a fever.  You get frequent nosebleeds.  You are getting nosebleeds more often. SEEK IMMEDIATE MEDICAL CARE IF:  Your nosebleed lasts longer than 20 minutes.  Your nosebleed occurs after an injury to your face, and your nose looks crooked or broken.  You have unusual bleeding from other parts of your body.  You have unusual bruising on other parts of your body.  You feel light-headed or you faint.  You become sweaty.  You vomit blood.  Your nosebleed occurs after a head injury.   This information is not intended to replace advice given to you by your health care provider. Make sure you discuss any questions you have with your health care provider.   Document Released: 11/22/2004 Document Revised: 03/05/2014 Document Reviewed: 09/28/2013 Elsevier Interactive Patient Education [employer]

## 2015-06-22 ENCOUNTER — Encounter: Payer: Self-pay | Admitting: Family Medicine

## 2015-06-22 ENCOUNTER — Ambulatory Visit (INDEPENDENT_AMBULATORY_CARE_PROVIDER_SITE_OTHER): Payer: 59 | Admitting: Family Medicine

## 2015-06-22 VITALS — BP 145/81 | HR 88 | Temp 98.2°F | Ht 69.0 in | Wt 149.0 lb

## 2015-06-22 DIAGNOSIS — R739 Hyperglycemia, unspecified: Secondary | ICD-10-CM

## 2015-06-22 DIAGNOSIS — R5383 Other fatigue: Secondary | ICD-10-CM | POA: Diagnosis not present

## 2015-06-22 LAB — CBC WITH DIFFERENTIAL/PLATELET
BASOS PCT: 0.5 % (ref 0.0–3.0)
Basophils Absolute: 0 10*3/uL (ref 0.0–0.1)
EOS PCT: 1.8 % (ref 0.0–5.0)
Eosinophils Absolute: 0.1 10*3/uL (ref 0.0–0.7)
HCT: 42 % (ref 39.0–52.0)
Hemoglobin: 14.3 g/dL (ref 13.0–17.0)
LYMPHS ABS: 2 10*3/uL (ref 0.7–4.0)
Lymphocytes Relative: 24.7 % (ref 12.0–46.0)
MCHC: 34.1 g/dL (ref 30.0–36.0)
MCV: 87 fl (ref 78.0–100.0)
MONO ABS: 0.5 10*3/uL (ref 0.1–1.0)
Monocytes Relative: 6.1 % (ref 3.0–12.0)
NEUTROS PCT: 66.9 % (ref 43.0–77.0)
Neutro Abs: 5.4 10*3/uL (ref 1.4–7.7)
Platelets: 340 10*3/uL (ref 150.0–400.0)
RBC: 4.82 Mil/uL (ref 4.22–5.81)
RDW: 13 % (ref 11.5–15.5)
WBC: 8.1 10*3/uL (ref 4.0–10.5)

## 2015-06-22 LAB — HEPATIC FUNCTION PANEL
ALK PHOS: 52 U/L (ref 39–117)
ALT: 24 U/L (ref 0–53)
AST: 19 U/L (ref 0–37)
Albumin: 4.8 g/dL (ref 3.5–5.2)
BILIRUBIN DIRECT: 0.1 mg/dL (ref 0.0–0.3)
BILIRUBIN TOTAL: 0.4 mg/dL (ref 0.2–1.2)
Total Protein: 7.9 g/dL (ref 6.0–8.3)

## 2015-06-22 LAB — BASIC METABOLIC PANEL
BUN: 11 mg/dL (ref 6–23)
CHLORIDE: 99 meq/L (ref 96–112)
CO2: 27 mEq/L (ref 19–32)
Calcium: 9.7 mg/dL (ref 8.4–10.5)
Creatinine, Ser: 0.9 mg/dL (ref 0.40–1.50)
GFR: 108.71 mL/min (ref >60.00)
Glucose, Bld: 178 mg/dL — ABNORMAL HIGH (ref 70–99)
POTASSIUM: 3.6 meq/L (ref 3.5–5.1)
Sodium: 136 mEq/L (ref 135–145)

## 2015-06-22 LAB — TSH: TSH: 0.76 u[IU]/mL (ref 0.35–4.50)

## 2015-06-22 NOTE — Progress Notes (Signed)
   Subjective:    Patient ID: Joseph Evans, male    DOB: [birth year], 25 y.o.   MRN: [id number]  HPI Here for advice about his health. He has not felt well for the past year and he cannot mention anything specific, but he thinks his lifestyle as a lot to do with this. He smokes 1/2 ppd of cigarettes and he drinks about 4 beers every day. He gets little exercise and he eats a poor diet. He averages 8 hours of sleep a night.    Review of Systems  Constitutional: Negative.   Respiratory: Negative.   Cardiovascular: Negative.   Endocrine: Negative.   Neurological: Negative.   Psychiatric/Behavioral: Negative.        Objective:   Physical Exam  Constitutional: He is oriented to person, place, and time. He appears well-developed and well-nourished.  Neck: No thyromegaly present.  Cardiovascular: Normal rate, regular rhythm, normal heart sounds and intact distal pulses.   Pulmonary/Chest: Effort normal and breath sounds normal.  Lymphadenopathy:    He has no cervical adenopathy.  Neurological: He is alert and oriented to person, place, and time.  Psychiatric: He has a normal mood and affect. His behavior is normal. Thought content normal.          Assessment & Plan:  I think a combination of poor habits has him feeling sub par. We will screen with some basic labs today. I advised him to stop smoking immediately and he can use OTC nicotine patches to help with this. He should limit his alcohol intake to no more than 2 beers a day. He should get 45 minutes of cardiovascular exercise 5-6 days a week. He should drink more water, consume less fatty foods and carbs, and eat more fruits and vegetables. Recheck in one month  Nelwyn SalisburyFRY,Montgomery Rothlisberger A, MD

## 2015-06-22 NOTE — Progress Notes (Signed)
Pre visit review using our clinic review tool, if applicable. No additional management support is needed unless otherwise documented below in the visit note. 

## 2015-06-23 NOTE — Addendum Note (Signed)
Addended by: Gershon CraneFRY, STEPHEN A on: 06/23/2015 08:42 AM   Modules accepted: Orders

## 2015-08-18 ENCOUNTER — Ambulatory Visit (INDEPENDENT_AMBULATORY_CARE_PROVIDER_SITE_OTHER): Payer: 59 | Admitting: Family Medicine

## 2015-08-18 VITALS — BP 140/72 | HR 98 | Temp 98.0°F | Resp 18 | Ht 69.0 in | Wt 146.0 lb

## 2015-08-18 DIAGNOSIS — R350 Frequency of micturition: Secondary | ICD-10-CM | POA: Diagnosis not present

## 2015-08-18 DIAGNOSIS — Z23 Encounter for immunization: Secondary | ICD-10-CM

## 2015-08-18 DIAGNOSIS — R1084 Generalized abdominal pain: Secondary | ICD-10-CM | POA: Diagnosis not present

## 2015-08-18 DIAGNOSIS — R5383 Other fatigue: Secondary | ICD-10-CM | POA: Diagnosis not present

## 2015-08-18 DIAGNOSIS — M546 Pain in thoracic spine: Secondary | ICD-10-CM

## 2015-08-18 DIAGNOSIS — R739 Hyperglycemia, unspecified: Secondary | ICD-10-CM | POA: Diagnosis not present

## 2015-08-18 LAB — GLUCOSE, POCT (MANUAL RESULT ENTRY): POC Glucose: 94 mg/dl (ref 70–99)

## 2015-08-18 LAB — POCT CBC
GRANULOCYTE PERCENT: 81 % — AB (ref 37–80)
HEMATOCRIT: 44.5 % (ref 43.5–53.7)
Hemoglobin: 15.7 g/dL (ref 14.1–18.1)
LYMPH, POC: 1.9 (ref 0.6–3.4)
MCH, POC: 30.5 pg (ref 27–31.2)
MCHC: 35.3 g/dL (ref 31.8–35.4)
MCV: 86.5 fL (ref 80–97)
MID (CBC): 0.9 (ref 0–0.9)
MPV: 7 fL (ref 0–99.8)
POC GRANULOCYTE: 12.1 — AB (ref 2–6.9)
POC LYMPH %: 12.9 % (ref 10–50)
POC MID %: 6.1 % (ref 0–12)
Platelet Count, POC: 346 10*3/uL (ref 142–424)
RBC: 5.14 M/uL (ref 4.69–6.13)
RDW, POC: 12.7 %
WBC: 15 10*3/uL — AB (ref 4.6–10.2)

## 2015-08-18 LAB — POCT GLYCOSYLATED HEMOGLOBIN (HGB A1C): Hemoglobin A1C: 5.3

## 2015-08-18 LAB — POCT URINALYSIS DIP (MANUAL ENTRY)
BILIRUBIN UA: NEGATIVE
Bilirubin, UA: NEGATIVE
Glucose, UA: NEGATIVE
Leukocytes, UA: NEGATIVE
Nitrite, UA: NEGATIVE
PH UA: 7
Protein Ur, POC: NEGATIVE
SPEC GRAV UA: 1.01
UROBILINOGEN UA: 0.2

## 2015-08-18 LAB — POC MICROSCOPIC URINALYSIS (UMFC): MUCUS RE: ABSENT

## 2015-08-18 MED ORDER — DOXYCYCLINE HYCLATE 100 MG PO CAPS: 100.0000 mg | ORAL_CAPSULE | Freq: Two times a day (BID) | ORAL | Status: DC

## 2015-08-18 NOTE — Patient Instructions (Addendum)
1.  Zantac/Ranitidine 150mg  -- one tablet twice daily for two weeks.  2.  Eat blandly for the next two weeks.  Avoid spicy and fried foods.    IF you received an x-ray today, you will receive an invoice from [facility]. Please contact [facility] at [phone] with questions or concerns regarding your invoice.   IF you received labwork today, you will receive an invoice from [facility]. Please contact [facility] at [phone] with questions or concerns regarding your invoice.   Our billing staff will not be able to assist you with questions regarding bills from these companies.  You will be contacted with the lab results as soon as they are available. The fastest way to get your results is to activate your My Chart account. Instructions are located on the last page of this paperwork. If you have not heard from us regarding the results in 2 weeks, please contact this office.

## 2015-08-18 NOTE — Progress Notes (Signed)
Subjective:    Patient ID: Joseph Evans, male    DOB: 1990/10/15, 25 y.o.   MRN: [id number]  08/18/2015  Abdominal Pain and Urinary Frequency   HPI This 25 y.o. male presents for evaluation of abdominal pain, urinary frequency, R flank pain.  On Saturday night, drank heavily and vomited x 4 that night.  Then the next day, [place]. Ate fried food.  Two days later, stomach felt really acidic.  So ate bland foods with water. Drank a lot of water throughout the following three days.  Baseline alcohol intake 4 drinks per day.  Had one really dark stool and th en having light stools. Urinating six times per day; way more than normal. Urinating fully. Sexually active as well; dysuria mildly after intercourse over the weekend.  Did a lot of physical labor yesterday; works for [employer]; lifted a lot of boxes.  Felt fine. Ate pizza with 4 beers.  DRank water.  This morning, felt horrible; drank coffee, cereal, 2-3 bottles of water.  Had two b.m. Today; usually goes once every three days.  Extremely exhausted, feels out of it.  Pupils constricted/shrinking; feeling zonked.  [facility]  Mother is worried about kidney infection. +low grade 99.1 at home.  Dry mouth.  No chills/sweats.  Has a slight tingling in feet.  Shaky in hands.    No sore throat, rhinorrhea, nasal congestion, ear pain.  Smoker's cough; no worsening coughing.  Some chronic sinus congestion; chronic; no worsening.   +wheezing.  No vomiting in five days.  Upset stomach; a lot of acid build up; drinks milk and bread with temporary relief.  Churning of stomach.  No hematuria.  Foamy urine; bubbling urine.  Clear urine; no muddy urine.  No swelling.  No penile discharge; no testicular swelling.  No throbbing pain.  No rash.     Review of Systems  Constitutional: Negative for fever, chills, diaphoresis, activity change, appetite change and fatigue.  HENT: Negative for congestion, ear pain, postnasal drip, sneezing  and sore throat.   Respiratory: Negative for cough and shortness of breath.   Cardiovascular: Negative for chest pain, palpitations and leg swelling.  Gastrointestinal: Positive for abdominal pain. Negative for nausea, vomiting and diarrhea.  Endocrine: Negative for cold intolerance, heat intolerance, polydipsia, polyphagia and polyuria.  Genitourinary: Positive for dysuria, urgency and frequency. Negative for hematuria, decreased urine volume, discharge, penile swelling, scrotal swelling, genital sores, penile pain and testicular pain.  Skin: Negative for color change, rash and wound.  Neurological: Negative for dizziness, tremors, seizures, syncope, facial asymmetry, speech difficulty, weakness, light-headedness, numbness and headaches.  Psychiatric/Behavioral: Negative for sleep disturbance and dysphoric mood. The patient is not nervous/anxious.     Past Medical History  Diagnosis Date  . Depression   . Asthma     resolved as a teenager   . Allergy   . Anxiety    Past Surgical History  Procedure Laterality Date  . Tympanostomy tube placement  2012    left ear, per Dr. Christia Readingwight Bates    No Known Allergies  Social History   Social History  . Marital Status: Single    Spouse Name: N/A  . Number of Children: N/A  . Years of Education: N/A   Occupational History  . Not on file.   Social History Main Topics  . Smoking status: Current Every Day Smoker -- 0.50 packs/day for 12 years    Types: Cigarettes  . Smokeless tobacco:  Current User    Types: Chew     Comment: 1 or the other  . Alcohol Use: 16.8 oz/week    28 Cans of beer per week     Comment: 4 per day  . Drug Use: Yes    Special: Marijuana  . Sexual Activity: Yes     Comment: male partners   Other Topics Concern  . Not on file   Social History Narrative   Occupation:  [employer]   Single   Alcohol use: yes   Current Smoker   Recreational Drug use:  "smokes weed occ"   Family History  Problem  Relation Age of Onset  . Heart disease Mother   . Hypertension Mother   . Mental illness Mother   . Hyperlipidemia Mother   . Arthritis Mother   . Heart disease Father   . Hypertension Father   . Mental illness Father   . Heart disease Maternal Grandmother   . Hypertension Maternal Grandmother   . Mental illness Maternal Grandmother   . Cancer Maternal Grandfather     prostate  . Heart disease Maternal Grandfather   . Hypertension Maternal Grandfather   . Mental illness Maternal Grandfather   . Heart disease Paternal Grandmother   . Hypertension Paternal Grandmother   . Mental illness Paternal Grandmother   . Cancer Paternal Grandfather     prostate  . Heart disease Paternal Grandfather   . Hypertension Paternal Grandfather   . Mental illness Paternal Grandfather   . Alcohol abuse Other        Objective:    BP 140/72 mmHg  Pulse 98  Temp(Src) 98 F (36.7 C) (Oral)  Resp 18  Ht 5\' 9"  (1.753 m)  Wt 146 lb (66.225 kg)  BMI 21.55 kg/m2  SpO2 100% Physical Exam  Constitutional: He is oriented to person, place, and time. He appears well-developed and well-nourished. No distress.  HENT:  Head: Normocephalic and atraumatic.  Right Ear: External ear normal.  Left Ear: External ear normal.  Nose: Nose normal.  Mouth/Throat: Oropharynx is clear and moist.  Eyes: Conjunctivae and EOM are normal. Pupils are equal, round, and reactive to light.  Neck: Normal range of motion. Neck supple. Carotid bruit is not present. No thyromegaly present.  Cardiovascular: Normal rate, regular rhythm, normal heart sounds and intact distal pulses.  Exam reveals no gallop and no friction rub.   No murmur heard. Pulmonary/Chest: Effort normal and breath sounds normal. He has no wheezes. He has no rales.  Abdominal: Soft. Bowel sounds are normal. He exhibits no distension and no mass. There is no tenderness. There is no rebound and no guarding.  Lymphadenopathy:    He has no cervical adenopathy.   Neurological: He is alert and oriented to person, place, and time. No cranial nerve deficit.  Skin: Skin is warm and dry. No rash noted. He is not diaphoretic.  Psychiatric: He has a normal mood and affect. His behavior is normal.  Nursing note and vitals reviewed.       Assessment & Plan:   1. Urinary frequency   2. Other fatigue   3. Right-sided thoracic back pain   4. Hyperglycemia   5. Generalized abdominal pain   6. Need for Tdap vaccination     Orders Placed This Encounter  Procedures  . GC/Chlamydia Probe Amp  . Urine culture  . Tdap vaccine greater than or equal to 7yo IM  . Comprehensive metabolic panel  . Lipase  .  Amylase  . POCT CBC  . POCT glucose (manual entry)  . POCT glycosylated hemoglobin (Hb A1C)  . POCT Microscopic Urinalysis ([facility])  . POCT urinalysis dipstick   Meds ordered this encounter  Medications  . doxycycline (VIBRAMYCIN) 100 MG capsule    Sig: Take 1 capsule (100 mg total) by mouth 2 (two) times daily.    Dispense:  20 capsule    Refill:  0    No Follow-up on file.    Horice Carrero Paulita Fujita, M.D. [facility] [address] [place], [place]  [address] [phone] phone [phone] fax

## 2015-08-19 LAB — LIPASE: Lipase: 24 U/L (ref 7–60)

## 2015-08-19 LAB — COMPREHENSIVE METABOLIC PANEL
ALBUMIN: 5.3 g/dL — AB (ref 3.6–5.1)
ALT: 25 U/L (ref 9–46)
AST: 23 U/L (ref 10–40)
Alkaline Phosphatase: 60 U/L (ref 40–115)
BUN: 15 mg/dL (ref 7–25)
CALCIUM: 10.3 mg/dL (ref 8.6–10.3)
CHLORIDE: 102 mmol/L (ref 98–110)
CO2: 25 mmol/L (ref 20–31)
CREATININE: 0.92 mg/dL (ref 0.60–1.35)
GLUCOSE: 94 mg/dL (ref 65–99)
Potassium: 4.2 mmol/L (ref 3.5–5.3)
SODIUM: 140 mmol/L (ref 135–146)
Total Bilirubin: 0.5 mg/dL (ref 0.2–1.2)
Total Protein: 8.3 g/dL — ABNORMAL HIGH (ref 6.1–8.1)

## 2015-08-19 LAB — AMYLASE: AMYLASE: 62 U/L (ref 0–105)

## 2015-08-20 LAB — GC/CHLAMYDIA PROBE AMP
CT Probe RNA: DETECTED — AB
GC Probe RNA: NOT DETECTED

## 2015-08-20 LAB — URINE CULTURE
Colony Count: NO GROWTH
Organism ID, Bacteria: NO GROWTH

## 2015-08-26 ENCOUNTER — Telehealth: Payer: Self-pay

## 2015-08-26 NOTE — Telephone Encounter (Signed)
Dr. Katrinka BlazingSmith Pt. Called for lab results

## 2015-08-31 NOTE — Telephone Encounter (Signed)
Please call patient with lab results

## 2015-09-10 NOTE — Telephone Encounter (Signed)
Pt contacted by Dr. Katrinka BlazingSmith on 08/26/15.

## 2015-10-07 ENCOUNTER — Ambulatory Visit (INDEPENDENT_AMBULATORY_CARE_PROVIDER_SITE_OTHER): Payer: 59 | Admitting: Family Medicine

## 2015-10-07 ENCOUNTER — Encounter: Payer: Self-pay | Admitting: Family Medicine

## 2015-10-07 VITALS — BP 134/77 | HR 89 | Temp 99.1°F | Ht 69.0 in | Wt 143.0 lb

## 2015-10-07 DIAGNOSIS — A749 Chlamydial infection, unspecified: Secondary | ICD-10-CM

## 2015-10-07 NOTE — Progress Notes (Signed)
Pre visit review using our clinic review tool, if applicable. No additional management support is needed unless otherwise documented below in the visit note. 

## 2015-10-07 NOTE — Progress Notes (Signed)
   Subjective:    Patient ID: Joseph Evans, male    DOB: 04-Mar-1990, 25 y.o.   MRN: [id number]  HPI Here for one month of symptoms that come and go. This started with right flank pain and a low grade fever, and he saw Urgent Care on 08-18-15. That day his WBC count was elevated to 15 and he tested positive for Chlamydia. He tested negative for gonorrhea and his urine culture was negative. He was prescribed Doxycyline but he never picked up the prescription. His symptoms improved for awhile but in the past week he has felt worse again. He has low grade fevers, he has nausea and he vomited once. He has had watery diarrhea. He has been drinking a large amount of alcohol, primarily beer, and very little water. No urinary symptoms.    Review of Systems  Constitutional: Positive for fever.  HENT: Negative.   Eyes: Negative.   Respiratory: Negative.   Cardiovascular: Negative.   Gastrointestinal: Positive for diarrhea, nausea and vomiting. Negative for abdominal distention, abdominal pain, anal bleeding, blood in stool, constipation and rectal pain.  Genitourinary: Positive for flank pain. Negative for difficulty urinating, discharge, dysuria, frequency, hematuria, testicular pain and urgency.  Neurological: Negative.        Objective:   Physical Exam  Constitutional: He is oriented to person, place, and time. He appears well-developed and well-nourished.  Cardiovascular: Normal rate, regular rhythm, normal heart sounds and intact distal pulses.   Pulmonary/Chest: Effort normal and breath sounds normal.  Abdominal: Soft. Bowel sounds are normal. He exhibits no distension and no mass. There is no tenderness. There is no rebound and no guarding.  Neurological: He is alert and oriented to person, place, and time.          Assessment & Plan:  Untreated Chlamydia infection. He will drink plenty of water and decrease his alcohol consumption. Given Doxycycline for 10 days, and he promised me that  he will take all of this.

## 2016-03-26 ENCOUNTER — Ambulatory Visit (INDEPENDENT_AMBULATORY_CARE_PROVIDER_SITE_OTHER): Payer: BLUE CROSS/BLUE SHIELD | Admitting: Family Medicine

## 2016-03-26 VITALS — BP 130/82 | HR 84 | Temp 98.1°F | Resp 18 | Ht 69.0 in | Wt 148.0 lb

## 2016-03-26 DIAGNOSIS — Z23 Encounter for immunization: Secondary | ICD-10-CM

## 2016-03-26 DIAGNOSIS — M546 Pain in thoracic spine: Secondary | ICD-10-CM | POA: Diagnosis not present

## 2016-03-26 DIAGNOSIS — M9901 Segmental and somatic dysfunction of cervical region: Secondary | ICD-10-CM | POA: Diagnosis not present

## 2016-03-26 DIAGNOSIS — R300 Dysuria: Secondary | ICD-10-CM

## 2016-03-26 DIAGNOSIS — M9902 Segmental and somatic dysfunction of thoracic region: Secondary | ICD-10-CM | POA: Diagnosis not present

## 2016-03-26 DIAGNOSIS — M62830 Muscle spasm of back: Secondary | ICD-10-CM | POA: Diagnosis not present

## 2016-03-26 DIAGNOSIS — A64 Unspecified sexually transmitted disease: Secondary | ICD-10-CM

## 2016-03-26 DIAGNOSIS — R140 Abdominal distension (gaseous): Secondary | ICD-10-CM

## 2016-03-26 DIAGNOSIS — R0981 Nasal congestion: Secondary | ICD-10-CM | POA: Diagnosis not present

## 2016-03-26 LAB — POCT URINALYSIS DIP (MANUAL ENTRY)
Bilirubin, UA: NEGATIVE
GLUCOSE UA: NEGATIVE
Ketones, POC UA: NEGATIVE
LEUKOCYTES UA: NEGATIVE
NITRITE UA: NEGATIVE
PH UA: 7
Protein Ur, POC: NEGATIVE
RBC UA: NEGATIVE
Spec Grav, UA: 1.01
UROBILINOGEN UA: 0.2

## 2016-03-26 MED ORDER — CETIRIZINE HCL 10 MG PO TABS
10.0000 mg | ORAL_TABLET | Freq: Every day | ORAL | 11 refills | Status: DC
Start: 2016-03-26 — End: 2016-07-27

## 2016-03-26 MED ORDER — NAPROXEN 500 MG PO TABS: 500.0000 mg | ORAL_TABLET | Freq: Two times a day (BID) | ORAL | 0 refills | Status: DC

## 2016-03-26 MED ORDER — IPRATROPIUM BROMIDE 0.03 % NA SOLN: 2.0000 | Freq: Two times a day (BID) | NASAL | 0 refills | Status: DC

## 2016-03-26 NOTE — Patient Instructions (Addendum)
Clavicle Strain Naproxen 500 mg twice daily with food x 10 days  Abdominal Discomfort and Bloating  Avoid excessive intake of oily and sugary foods/drinks.  Nasal Congestion  Ipratropium (Atrovent) 2 sprays, twice daily to relieve congestion. Start Zyrtec (over the counter) 10 mg at bedtime for nasal congestion.  Your lab work will be update to Pharmacologist.  Return for follow-up to schedule an appointment for Anxiety evaluation.  IF you received an x-ray today, you will receive an invoice from [facility]. Please contact [facility] at [phone] with questions or concerns regarding your invoice.   IF you received labwork today, you will receive an invoice from [facility]. Please contact [facility] at [phone] with questions or concerns regarding your invoice.   Our billing staff will not be able to assist you with questions regarding bills from these companies.  You will be contacted with the lab results as soon as they are available. The fastest way to get your results is to activate your My Chart account. Instructions are located on the last page of this paperwork. If you have not heard from [place] regarding the results in 2 weeks, please contact this office.    Generalized Anxiety Disorder Generalized anxiety disorder (GAD) is a mental disorder. It interferes with life functions, including relationships, work, and school. GAD is different from normal anxiety, which everyone experiences at some point in their lives in response to specific life events and activities. Normal anxiety actually helps [place] prepare for and get through these life events and activities. Normal anxiety goes away after the event or activity is over.  GAD causes anxiety that is not necessarily related to specific events or activities. It also causes excess anxiety in proportion to specific events or activities. The anxiety associated with GAD is also difficult to control. GAD can vary from mild to  severe. People with severe GAD can have intense waves of anxiety with physical symptoms (panic attacks).  SYMPTOMS The anxiety and worry associated with GAD are difficult to control. This anxiety and worry are related to many life events and activities and also occur more days than not for 6 months or longer. People with GAD also have three or more of the following symptoms (one or more in children):  Restlessness.   Fatigue.  Difficulty concentrating.   Irritability.  Muscle tension.  Difficulty sleeping or unsatisfying sleep. DIAGNOSIS GAD is diagnosed through an assessment by your health care provider. Your health care provider will ask you questions aboutyour mood,physical symptoms, and events in your life. Your health care provider may ask you about your medical history and use of alcohol or drugs, including prescription medicines. Your health care provider may also do a physical exam and blood tests. Certain medical conditions and the use of certain substances can cause symptoms similar to those associated with GAD. Your health care provider may refer you to a mental health specialist for further evaluation. TREATMENT The following therapies are usually used to treat GAD:   Medication. Antidepressant medication usually is prescribed for long-term daily control. Antianxiety medicines may be added in severe cases, especially when panic attacks occur.   Talk therapy (psychotherapy). Certain types of talk therapy can be helpful in treating GAD by providing support, education, and guidance. A form of talk therapy called cognitive behavioral therapy can teach you healthy ways to think about and react to daily life events and activities.  Stress managementtechniques. These include yoga, meditation, and exercise and can be very helpful when they are  practiced regularly. A mental health specialist can help determine which treatment is best for you. Some people see improvement with one  therapy. However, other people require a combination of therapies. This information is not intended to replace advice given to you by your health care provider. Make sure you discuss any questions you have with your health care provider. Document Released: 06/09/2012 Document Revised: 03/05/2014 Document Reviewed: 06/09/2012 Elsevier Interactive Patient Education  2017 [employer]

## 2016-03-26 NOTE — Progress Notes (Signed)
Patient ID: Joseph Evans, male    DOB: 1990/04/11, 26 y.o.   MRN: [id number]  PCP: Gershon Crane, MD  Chief Complaint  Patient presents with  . Nasal Congestion  . Sore Throat  . Sinusitis  . Nausea  . Pain    R side underneath rib cage    Subjective:  HPI 26 year old male presents for evaluation of upper respiratory illness with pain right side near rib cage. Pt reports pain is actually beginning at the right upper posterior clavicle and with bony tenderness terminating underneath the right axilla. Pain has been persistent for 1 month. Feels he likely injured it picking something up although he is uncertain. Reports that with his occupation he is constantly lifting heavy items and it is possible that he improperly picked something up. He reports that he hasn't taken any medication for the pain. Reports pain is not excruciating more of a irritating aching.  Past hx STD  Burning with urination continues to be symptomatic. He was diagnosed in August with Chlamydia. Completed antibiotics as prescribed. Would like to be rechecked today.  Nasal Congestion Has a sinus infection about one month ago. Today when he blew his nose he noticed some blood in mucus after blowing nose. He hasn't taken any medication nor does he take any antihistamines for allergy symptoms.  Abdominal discomfort with mixed stool X 1 week.  Discomfort, heartburn, and bloating. Stomach is very noisy after ingestion of meals especially fatty and sweet foods. He has been eating yogurt as a probiotic which he reports relief.  Social History   Social History  . Marital status: Single    Spouse name: N/A  . Number of children: N/A  . Years of education: N/A   Occupational History  . Not on file.   Social History Main Topics  . Smoking status: Former Smoker    Packs/day: 0.50    Years: 12.00    Types: Cigarettes    Quit date: 03/16/2016  . Smokeless tobacco: Former Neurosurgeon    Types: Chew    Quit date:  03/16/2016     Comment: 1 or the other  . Alcohol use 16.8 oz/week    28 Cans of beer per week     Comment: 4 per day  . Drug use: Yes    Types: Marijuana  . Sexual activity: Yes     Comment: male partners   Other Topics Concern  . Not on file   Social History Narrative   Occupation:  [employer]   Single   Alcohol use: yes   Current Smoker   Recreational Drug use:  "smokes weed occ"    Family History  Problem Relation Age of Onset  . Heart disease Mother   . Hypertension Mother   . Mental illness Mother   . Hyperlipidemia Mother   . Arthritis Mother   . Heart disease Father   . Hypertension Father   . Mental illness Father   . Heart disease Maternal Grandmother   . Hypertension Maternal Grandmother   . Mental illness Maternal Grandmother   . Cancer Maternal Grandfather     prostate  . Heart disease Maternal Grandfather   . Hypertension Maternal Grandfather   . Mental illness Maternal Grandfather   . Heart disease Paternal Grandmother   . Hypertension Paternal Grandmother   . Mental illness Paternal Grandmother   . Cancer Paternal Grandfather     prostate  . Heart disease Paternal Grandfather   .  Hypertension Paternal Grandfather   . Mental illness Paternal Grandfather   . Alcohol abuse Other    Review of Systems See HPI Patient Active Problem List   Diagnosis Date Noted  . DEPRESSION 12/07/2009  . HYPERTENSION 12/07/2009  . ACUTE SINUSITIS, UNSPECIFIED 12/07/2009  . ASTHMA 12/07/2009    No Known Allergies  Prior to Admission medications   Medication Sig Start Date End Date Taking? Authorizing Provider  doxycycline (VIBRAMYCIN) 100 MG capsule Take 1 capsule (100 mg total) by mouth 2 (two) times daily. Patient not taking: Reported on 10/07/2015 08/18/15   Ethelda ChickKristi M Smith, MD    Past Medical, Surgical Family and Social History reviewed and updated.    Objective:   Today's Vitals   03/26/16 1346  BP: (!) 158/80  Pulse: 84  Resp: 18    Temp: 98.1 F (36.7 C)  TempSrc: Oral  SpO2: 100%  Weight: 148 lb (67.1 kg)  Height: 5\' 9"  (1.753 m)    Wt Readings from Last 3 Encounters:  03/26/16 148 lb (67.1 kg)  10/07/15 143 lb (64.9 kg)  08/18/15 146 lb (66.2 kg)   Physical Exam  Constitutional: He is oriented to person, place, and time. He appears well-developed and well-nourished.  HENT:  Head: Normocephalic.  Right Ear: Hearing, tympanic membrane, external ear and ear canal normal.  Left Ear: Hearing, tympanic membrane, external ear and ear canal normal.  Nose: Rhinorrhea present.  Mouth/Throat: Oropharynx is clear and moist.  Eyes: Conjunctivae and EOM are normal. Pupils are equal, round, and reactive to light.  Neck: Normal range of motion. Neck supple.  Cardiovascular: Normal rate, regular rhythm, normal heart sounds and intact distal pulses.   Pulmonary/Chest: Effort normal and breath sounds normal.  Abdominal: Soft. Bowel sounds are normal. There is no tenderness. There is no CVA tenderness.  Neurological: He is alert and oriented to person, place, and time.  Skin: Skin is warm and dry.  Psychiatric: He has a normal mood and affect. His behavior is normal. Judgment and thought content normal.    Assessment & Plan:  1. Nasal congestion Plan:  Ipratropium (Atrovent) 2 sprays, twice daily. Zyrtec 10 mg bedtime for nasal congestion.  2. STD (male) - GC/Chlamydia Probe Amp -POCT Dipstick  3. Need for prophylactic vaccination and inoculation against influenza - Flu Vaccine QUAD 36+ mos PF IM (Fluarix & Fluzone Quad PF)  4. Dysuria - POCT urinalysis dipstick  5. Abdominal bloating -Consider probiotic therapy, otc -Avoid excessive intake of oily and sugary foods/drinks.   Patient and I discussed that he needs to schedule an appointment for evaluation of his anxiety and frequent alcohol use.  Godfrey PickKimberly S. Tiburcio PeaHarris, MSN, FNP-C [facility] at [facility] [phone]

## 2016-03-27 ENCOUNTER — Ambulatory Visit: Payer: Self-pay | Admitting: Family Medicine

## 2016-03-28 LAB — GC/CHLAMYDIA PROBE AMP
Chlamydia trachomatis, NAA: NEGATIVE
Neisseria gonorrhoeae by PCR: NEGATIVE

## 2016-03-29 DIAGNOSIS — M9901 Segmental and somatic dysfunction of cervical region: Secondary | ICD-10-CM | POA: Diagnosis not present

## 2016-03-29 DIAGNOSIS — M9902 Segmental and somatic dysfunction of thoracic region: Secondary | ICD-10-CM | POA: Diagnosis not present

## 2016-03-29 DIAGNOSIS — M546 Pain in thoracic spine: Secondary | ICD-10-CM | POA: Diagnosis not present

## 2016-03-29 DIAGNOSIS — M62830 Muscle spasm of back: Secondary | ICD-10-CM | POA: Diagnosis not present

## 2016-04-25 ENCOUNTER — Ambulatory Visit (INDEPENDENT_AMBULATORY_CARE_PROVIDER_SITE_OTHER): Payer: BLUE CROSS/BLUE SHIELD | Admitting: Family Medicine

## 2016-04-25 ENCOUNTER — Encounter: Payer: Self-pay | Admitting: Family Medicine

## 2016-04-25 VITALS — BP 139/87 | HR 81 | Temp 98.6°F | Ht 69.0 in | Wt 151.0 lb

## 2016-04-25 DIAGNOSIS — F419 Anxiety disorder, unspecified: Secondary | ICD-10-CM

## 2016-04-25 DIAGNOSIS — F17211 Nicotine dependence, cigarettes, in remission: Secondary | ICD-10-CM | POA: Diagnosis not present

## 2016-04-25 NOTE — Patient Instructions (Signed)
**  Coming March 12th**  [facility]!!!  Same Day Appointments for Acute Care: Sprains, Injuries, cuts, abrasions Colds, flu, sore throats, cough, upset stomachs Fever, ear pain Sinus and eye infections Animal/insect bites  3 Easy Ways to Schedule: Walk-In Scheduling Call in scheduling Mychart Sign-up: [email or web address]         

## 2016-04-25 NOTE — Progress Notes (Signed)
   Subjective:    Patient ID: Joseph Evans, male    DOB: 1990-09-20, 26 y.o.   MRN: [id number]  HPI Here to discuss a few issues. He was seen at urgent care a month ago for a nose bleed but this has resolved. He quit smoking cigarettes last month and he has been wearing nicotine patches. These irritate his skin so he is thinking about switching to lozenges. He wants to get back in to exercising and he wants to find ways to redcue the stress in his life. He took anxiety medications a few years ago and they helped but he had side effects.    Review of Systems  Constitutional: Negative.   HENT: Negative.   Respiratory: Negative.   Cardiovascular: Negative.   Psychiatric/Behavioral: Negative for agitation, confusion, decreased concentration, dysphoric mood, hallucinations and sleep disturbance. The patient is nervous/anxious.        Objective:   Physical Exam  Constitutional: He is oriented to person, place, and time. He appears well-developed and well-nourished.  Cardiovascular: Normal rate, regular rhythm, normal heart sounds and intact distal pulses.   Pulmonary/Chest: Effort normal and breath sounds normal.  Neurological: He is alert and oriented to person, place, and time.  Psychiatric: He has a normal mood and affect. His behavior is normal. Thought content normal.          Assessment & Plan:  As for the nicotine products, I agreed that switching to lozenges is a good idea. I congratulated him on quitting smoking. As for exercise and stress reduction, I recommended he try yoga. He does plan to give this a try.  Gershon CraneStephen Felice Hope, MD

## 2016-04-25 NOTE — Progress Notes (Signed)
Pre visit review using our clinic review tool, if applicable. No additional management support is needed unless otherwise documented below in the visit note. 

## 2016-05-29 ENCOUNTER — Ambulatory Visit: Payer: Self-pay | Admitting: Family Medicine

## 2016-06-13 ENCOUNTER — Ambulatory Visit: Payer: BLUE CROSS/BLUE SHIELD | Admitting: Family Medicine

## 2016-07-27 ENCOUNTER — Ambulatory Visit (INDEPENDENT_AMBULATORY_CARE_PROVIDER_SITE_OTHER): Payer: Worker's Compensation | Admitting: Physician Assistant

## 2016-07-27 ENCOUNTER — Encounter: Payer: Self-pay | Admitting: Physician Assistant

## 2016-07-27 VITALS — BP 138/82 | HR 72 | Temp 98.0°F | Resp 18 | Ht 68.9 in | Wt 154.0 lb

## 2016-07-27 DIAGNOSIS — S0990XA Unspecified injury of head, initial encounter: Secondary | ICD-10-CM | POA: Diagnosis not present

## 2016-07-27 DIAGNOSIS — S060X0A Concussion without loss of consciousness, initial encounter: Secondary | ICD-10-CM | POA: Diagnosis not present

## 2016-07-27 NOTE — Progress Notes (Signed)
MRN: [id number] DOB: 1990-03-28  Subjective:  Pt presents to clinic with an injury that occurred at work today about 4pm.  He was putting up a guitar case and it fell off the shelf that was about an arms length above head - the guitar case hit his head - since then slight dizzy and mild confusion and felt like his it took him a while to focus.  He had a light lunch and a small amount of water.  Overall he does feel better that he did after the accident but he is not back to normal.  Slight pain in the head not where it hit but around the side.  Used some ice but no medications.  History of concussion - diagnosed at least 3 maybe some more but he is not sure  -- undiagnosed at least 3   Review of Systems  Eyes: Negative for visual disturbance.  Gastrointestinal: Negative for nausea.  Neurological: Positive for dizziness (mild better now) and headaches (local pain).  Psychiatric/Behavioral: Positive for confusion (slight delay in thought process).    Patients medications, problem list and allergies reviewed. Patients social, past and surgical history is reviewed.   Objective:  BP 138/82 (BP Location: Left Arm, Patient Position: Sitting, Cuff Size: Normal)   Pulse 72   Temp 98 F (36.7 C)   Resp 18   Ht 5' 8.9" (1.75 m)   Wt 154 lb (69.9 kg)   SpO2 98%   BMI 22.81 kg/m   Physical Exam  Constitutional: He is oriented to person, place, and time and well-developed, well-nourished, and in no distress.  HENT:  Head: Normocephalic and atraumatic.  Right Ear: External ear normal.  Left Ear: External ear normal.  Eyes: Conjunctivae are normal.  Neck: Normal range of motion.  Cardiovascular: Normal rate, regular rhythm and normal heart sounds.   No murmur heard. Pulmonary/Chest: Effort normal and breath sounds normal. He has no wheezes.  Musculoskeletal: Normal range of motion.  Neurological: He is alert and oriented to person, place, and time. He has normal sensation, normal strength,  normal reflexes and intact cranial nerves. He is not disoriented. He has an abnormal Cerebellar Exam. He has a normal Straight Leg Raise Test, a normal Heel to [employer] Test, a normal Romberg Test and a normal Tandem Gait Test. He shows no pronator drift. Gait normal.  Rapid alternating movements intact  Skin: Skin is warm and dry.  Psychiatric: Mood, memory, affect and judgment normal.    Assessment and Plan :  Injury of head, initial encounter  Concussion without loss of consciousness, initial encounter  Pt to allow his brain to rest with decreased to almost no screen time - ok to listen to music and play  Music from memory - he should rest as much as possible - he is already feeling better - he plans to f/u in 2 days when at that point I would expect him to almost be resolved.  RTC precautions given to patient.  Joseph LennertSarah Jaxzen Vanhorn PA-C  [facility] 07/30/2016 9:28 PM

## 2016-07-27 NOTE — Patient Instructions (Addendum)
Concussion, Adult  A concussion is a brain injury from a direct hit (blow) to the head or body. This injury causes the brain to shake quickly back and forth inside the skull. It is caused by:   A hit to the head.   A quick and sudden movement (jolt) of the head or neck.    How fast you will get better from a concussion depends on many things like how bad your concussion was, what part of your brain was hurt, how old you are, and how healthy you were before the concussion. Recovery can take time. It is important to wait to return to activity until a doctor says it is safe and your symptoms are all gone.  Follow these instructions at home:  Activity   Limit activities that need a lot of thought or concentration. These include:  ? Homework or work for your job.  ? Watching TV.  ? Computer work.  ? Playing memory games and puzzles.   Rest. Rest helps the brain to heal. Make sure you:  ? Get plenty of sleep at night. Do not stay up late.  ? Go to bed at the same time every day.  ? Rest during the day. Take naps or rest breaks when you feel tired.   It can be dangerous if you get another concussion before the first one has healed Do not do activities that could cause a second concussion, such as riding a bike or playing sports.   Ask your doctor when you can return to your normal activities, like driving, riding a bike, or using machinery. Your ability to react may be slower. Do not do these activities if you are dizzy. Your doctor will likely give you a plan for slowly going back to activities.  General instructions   Take over-the-counter and prescription medicines only as told by your doctor.   Do not drink alcohol until your doctor says you can.   If it is harder than usual to remember things, write them down.   If you are easily distracted, try to do one thing at a time. For example, do not try to watch TV while making dinner.   Talk with family members or close friends when you need to make important  decisions.   Watch your symptoms and tell other people to do the same. Other problems (complications) can happen after a concussion. Older adults with a brain injury may have a higher risk of serious problems, such as a blood clot in the brain.   Tell your teachers, school nurse, school counselor, coach, athletic trainer, or work manager about your injury and symptoms. Tell them about what you can or cannot do. They should watch for:  ? More problems with attention or concentration.  ? More trouble remembering or learning new information.  ? More time needed to do tasks or assignments.  ? Being more annoyed (irritable) or having a harder time dealing with stress.  ? Any other symptoms that get worse.   Keep all follow-up visits as told by your health care provider. This is important.  Prevention   It is very important that you donot get another brain injury, especially before you have healed. In rare cases, another injury can cause permanent brain damage, brain swelling, or death. You have the most risk if you get another head injury in the first 7-10 days after you were hurt before. To avoid injuries:  ? Wear a seat belt when you ride in   Skiing.  Skateboarding.  Skating. ? Make your home safe by:  Removing things from the floor or stairs that could make you trip.  Using grab bars in bathrooms and handrails by stairs.  Placing non-slip mats on floors and in bathtubs.  Putting more light in dark areas. Contact a doctor if:  Your symptoms get worse.  You have new symptoms.  You keep having symptoms for more than 2 weeks. Get help right away if:  You have bad headaches, or your headaches get worse.  You have weakness in any part of your body.  You have loss of feeling (numbness).  You feel off  balance.  You keep throwing up (vomiting).  You feel more sleepy.  The black center of one eye (pupil) is bigger than the other one.  You twitch or shake violently (convulse) or have a seizure.  Your speech is not clear (is slurred).  You feel more tired, more confused, or more annoyed.  You do not recognize people or places.  You have neck pain.  It is hard to wake you up.  You have strange behavior changes.  You pass out (lose consciousness). Summary  A concussion is a brain injury from a direct hit (blow) to the head or body.  This condition is treated with rest and careful watching of symptoms.  If you keep having symptoms for more than 2 weeks, call your doctor. This information is not intended to replace advice given to you by your health care provider. Make sure you discuss any questions you have with your health care provider. Document Released: 01/31/2009 Document Revised: 01/28/2016 Document Reviewed: 01/28/2016 Elsevier Interactive Patient Education  2017 [employer]     IF you received an x-ray today, you will receive an invoice from [facility]. Please contact [facility] at [phone] with questions or concerns regarding your invoice.   IF you received labwork today, you will receive an invoice from [facility]. Please contact [facility] at [phone] with questions or concerns regarding your invoice.   Our billing staff will not be able to assist you with questions regarding bills from these companies.  You will be contacted with the lab results as soon as they are available. The fastest way to get your results is to activate your My Chart account. Instructions are located on the last page of this paperwork. If you have not heard from us regarding the results in 2 weeks, please contact this office.

## 2016-07-30 ENCOUNTER — Ambulatory Visit (INDEPENDENT_AMBULATORY_CARE_PROVIDER_SITE_OTHER): Payer: Worker's Compensation | Admitting: Physician Assistant

## 2016-07-30 ENCOUNTER — Encounter: Payer: Self-pay | Admitting: Physician Assistant

## 2016-07-30 VITALS — BP 132/78 | HR 77 | Temp 98.6°F | Resp 18 | Ht 68.9 in | Wt 152.4 lb

## 2016-07-30 DIAGNOSIS — S060X0A Concussion without loss of consciousness, initial encounter: Secondary | ICD-10-CM | POA: Diagnosis not present

## 2016-07-30 DIAGNOSIS — S0990XD Unspecified injury of head, subsequent encounter: Secondary | ICD-10-CM | POA: Diagnosis not present

## 2016-07-30 NOTE — Patient Instructions (Signed)
     IF you received an x-ray today, you will receive an invoice from [facility]. Please contact [facility] at [phone] with questions or concerns regarding your invoice.   IF you received labwork today, you will receive an invoice from [facility]. Please contact [facility] at [phone] with questions or concerns regarding your invoice.   Our billing staff will not be able to assist you with questions regarding bills from these companies.  You will be contacted with the lab results as soon as they are available. The fastest way to get your results is to activate your My Chart account. Instructions are located on the last page of this paperwork. If you have not heard from us regarding the results in 2 weeks, please contact this office.     

## 2016-07-30 NOTE — Progress Notes (Signed)
   Joseph Evans  MRN: [id number] DOB: 04/29/1990  PCP: Nelwyn SalisburyFry, Stephen A, MD  Chief Complaint  Patient presents with  . Follow-up    head injury     Subjective:  Pt presents to clinic for recheck.  He feels much better.  Cognitive function is back to normal.  Sleepy ok.  No headaches or dizziness. No nausea.  Yesterday he had a mild headache that resolved spontaneously without medication.    Review of Systems  Constitutional: Negative for chills and unexpected weight change.  Eyes: Positive for visual disturbance (mild floaters at times ).  Neurological: Negative for dizziness, weakness, light-headedness and headaches.  Psychiatric/Behavioral: Negative for decreased concentration and sleep disturbance.     No current outpatient prescriptions on file prior to visit.   No current facility-administered medications on file prior to visit.     No Known Allergies  Pt patients past, family and social history were reviewed and updated.   Objective:  BP 132/78   Pulse 77   Temp 98.6 F (37 C) (Oral)   Resp 18   Ht 5' 8.9" (1.75 m)   Wt 152 lb 6.4 oz (69.1 kg)   SpO2 98%   BMI 22.57 kg/m   Physical Exam  Constitutional: He is oriented to person, place, and time and well-developed, well-nourished, and in no distress.  HENT:  Head: Normocephalic and atraumatic.    Right Ear: External ear normal.  Left Ear: External ear normal.  Eyes: Conjunctivae are normal.  Neck: Normal range of motion.  Cardiovascular: Normal rate, regular rhythm and normal heart sounds.   No murmur heard. Pulmonary/Chest: Effort normal and breath sounds normal. He has no wheezes.  Neurological: He is alert and oriented to person, place, and time. He has normal motor skills, normal sensation, normal strength and normal reflexes. He has a normal Cerebellar Exam. Gait normal.  Skin: Skin is warm and dry.  Psychiatric: Mood, memory, affect and judgment normal.    Assessment and Plan :  Injury of  head, subsequent encounter  Concussion without loss of consciousness, initial encounter   Symptoms have resolved - if the symptoms return during activities decrease activities as he needs to tolerate it - ok to go back to work  Benny LennertSarah Shalisa Mcquade PA-C  [facility] 07/30/2016 9:30 AM

## 2016-08-09 ENCOUNTER — Ambulatory Visit (INDEPENDENT_AMBULATORY_CARE_PROVIDER_SITE_OTHER): Payer: BLUE CROSS/BLUE SHIELD | Admitting: Family Medicine

## 2016-08-09 ENCOUNTER — Encounter: Payer: Self-pay | Admitting: Family Medicine

## 2016-08-09 VITALS — BP 138/72 | HR 78 | Temp 99.0°F | Ht 69.0 in | Wt 151.0 lb

## 2016-08-09 DIAGNOSIS — F418 Other specified anxiety disorders: Secondary | ICD-10-CM

## 2016-08-09 DIAGNOSIS — S060X0D Concussion without loss of consciousness, subsequent encounter: Secondary | ICD-10-CM | POA: Diagnosis not present

## 2016-08-09 MED ORDER — SERTRALINE HCL 50 MG PO TABS: 50.0000 mg | ORAL_TABLET | Freq: Every day | ORAL | 3 refills | Status: DC

## 2016-08-09 NOTE — Progress Notes (Signed)
   Subjective:    Patient ID: Johny Shearsthan D Zavaleta, male    DOB: 01/21/1991, 26 y.o.   MRN: [id number]  HPI Here for several issues. Fist he feels very stressed and he thinks he has a combination of anxiety and depression. He took Clonapin in the past but all this did was sedate him. He worries about things and gets tearful at times. He wants to discuss a recent concussion also. While at work a boxed guitar fell on his head and sruck him. He felt dazed but there was no LOC. He went to Urgent Care as determined by Workers Comp, and he was diagnosed with a mild concussion. No head imaging was ordered. Since then he has felt totally back to normal.    Review of Systems  Constitutional: Negative.   Respiratory: Negative.   Cardiovascular: Negative.   Gastrointestinal: Negative.   Neurological: Negative.   Psychiatric/Behavioral: Positive for dysphoric mood. Negative for agitation, confusion, decreased concentration, hallucinations, self-injury, sleep disturbance and suicidal ideas. The patient is nervous/anxious.        Objective:   Physical Exam  Constitutional: He is oriented to person, place, and time. He appears well-developed and well-nourished.  Cardiovascular: Normal rate, regular rhythm, normal heart sounds and intact distal pulses.   Pulmonary/Chest: Effort normal and breath sounds normal. No respiratory distress. He has no wheezes. He has no rales.  Neurological: He is alert and oriented to person, place, and time. No cranial nerve deficit. He exhibits normal muscle tone. Coordination normal.  Psychiatric: He has a normal mood and affect. His behavior is normal. Thought content normal.          Assessment & Plan:  His concussion has resolved. He has anxiety and depression and he will try Zoloft 50 mg daily. I recommended he see a therapist as well, but he wants to think about this first.  Gershon CraneStephen Barnabas Henriques, MD

## 2016-08-09 NOTE — Patient Instructions (Signed)
WE NOW OFFER   [facility] FAST TRACK!!!  SAME DAY Appointments for ACUTE CARE  Such as: Sprains, Injuries, cuts, abrasions, rashes, muscle pain, joint pain, back pain Colds, flu, sore throats, headache, allergies, cough, fever  Ear pain, sinus and eye infections Abdominal pain, nausea, vomiting, diarrhea, upset stomach Animal/insect bites  3 Easy Ways to Schedule: Walk-In Scheduling Call in scheduling Mychart Sign-up: [email or web address]         

## 2016-09-06 DIAGNOSIS — H43393 Other vitreous opacities, bilateral: Secondary | ICD-10-CM | POA: Diagnosis not present

## 2016-11-15 ENCOUNTER — Encounter: Payer: Self-pay | Admitting: Family Medicine

## 2016-12-06 ENCOUNTER — Encounter: Payer: Self-pay | Admitting: Family Medicine

## 2016-12-06 ENCOUNTER — Ambulatory Visit (INDEPENDENT_AMBULATORY_CARE_PROVIDER_SITE_OTHER): Payer: BLUE CROSS/BLUE SHIELD | Admitting: Family Medicine

## 2016-12-06 VITALS — BP 138/88 | HR 79 | Temp 98.6°F | Ht 69.0 in | Wt 145.0 lb

## 2016-12-06 DIAGNOSIS — F418 Other specified anxiety disorders: Secondary | ICD-10-CM | POA: Diagnosis not present

## 2016-12-06 NOTE — Patient Instructions (Signed)
WE NOW OFFER   [facility] FAST TRACK!!!  SAME DAY Appointments for ACUTE CARE  Such as: Sprains, Injuries, cuts, abrasions, rashes, muscle pain, joint pain, back pain Colds, flu, sore throats, headache, allergies, cough, fever  Ear pain, sinus and eye infections Abdominal pain, nausea, vomiting, diarrhea, upset stomach Animal/insect bites  3 Easy Ways to Schedule: Walk-In Scheduling Call in scheduling Mychart Sign-up: [email or web address]         

## 2016-12-06 NOTE — Progress Notes (Signed)
Subjective:    Patient ID: Joseph Evans, male    DOB: 1990/12/30, 26 y.o.   MRN: [id number]  HPI Here to discuss his anxiety. This has been a problem for the past year, and we gave him a rx for Zoloft in early June. However he admits that he was drinking too much alcohol at that time, and he was not sure of the Zoloft would be safe to take. He met a girlfriend this summer and she has been very helpful to him. He has decreased his alcohol intake to 2-3 beers a day, and now he asks if it would be safe to start the Zoloft. He also asks about seeing a therapist.    Review of Systems  Constitutional: Negative.   Respiratory: Negative.   Cardiovascular: Negative.   Neurological: Negative.   Psychiatric/Behavioral: Positive for dysphoric mood. Negative for agitation, confusion, decreased concentration and hallucinations. The patient is nervous/anxious.        Objective:   Physical Exam  Constitutional: He is oriented to person, place, and time. He appears well-developed and well-nourished.  Cardiovascular: Normal rate, regular rhythm, normal heart sounds and intact distal pulses.   Pulmonary/Chest: Effort normal and breath sounds normal. No respiratory distress. He has no wheezes. He has no rales.  Neurological: He is alert and oriented to person, place, and time.  Psychiatric: His behavior is normal. Judgment and thought content normal.  Anxious           Assessment & Plan:  He is still dealing with anxiety, and I congratulated him on reducing his alcohol use. I told him to start taking Zoloft now, and I gave him info about contacting [facility] for therapy. Recheck one month. Alysia Penna, MD

## 2017-02-14 ENCOUNTER — Ambulatory Visit: Payer: BLUE CROSS/BLUE SHIELD | Admitting: Family Medicine

## 2017-02-14 ENCOUNTER — Encounter: Payer: Self-pay | Admitting: Family Medicine

## 2017-02-14 VITALS — BP 118/66 | HR 69 | Temp 98.3°F | Wt 147.6 lb

## 2017-02-14 DIAGNOSIS — J209 Acute bronchitis, unspecified: Secondary | ICD-10-CM | POA: Diagnosis not present

## 2017-02-14 MED ORDER — AZITHROMYCIN 250 MG PO TABS: ORAL_TABLET | ORAL | 0 refills | Status: AC

## 2017-02-14 NOTE — Progress Notes (Signed)
   Subjective:    Patient ID: Joseph Evans, male    DOB: [birth year], 26 y.o.   MRN: [id number]  HPI Here for 3 weeks of PND, chest congestion and a dry cough. No fever.    Review of Systems  Constitutional: Negative.   HENT: Positive for postnasal drip. Negative for sinus pressure, sinus pain and sore throat.   Eyes: Negative.   Respiratory: Positive for cough.        Objective:   Physical Exam  Constitutional: He appears well-developed and well-nourished.  HENT:  Right Ear: External ear normal.  Left Ear: External ear normal.  Nose: Nose normal.  Mouth/Throat: Oropharynx is clear and moist.  Eyes: Conjunctivae are normal.  Neck: No thyromegaly present.  Pulmonary/Chest: Effort normal and breath sounds normal. No respiratory distress. He has no wheezes. He has no rales.  Lymphadenopathy:    He has no cervical adenopathy.          Assessment & Plan:  Bronchitis, treat with a Zpack . Gershon CraneStephen Breylen Agyeman, MD

## 2017-02-22 DIAGNOSIS — F4323 Adjustment disorder with mixed anxiety and depressed mood: Secondary | ICD-10-CM | POA: Diagnosis not present

## 2017-03-18 DIAGNOSIS — F4323 Adjustment disorder with mixed anxiety and depressed mood: Secondary | ICD-10-CM | POA: Diagnosis not present

## 2017-05-28 ENCOUNTER — Ambulatory Visit (INDEPENDENT_AMBULATORY_CARE_PROVIDER_SITE_OTHER): Payer: BLUE CROSS/BLUE SHIELD | Admitting: Family Medicine

## 2017-05-28 ENCOUNTER — Encounter: Payer: Self-pay | Admitting: Family Medicine

## 2017-05-28 VITALS — BP 120/62 | HR 80 | Temp 98.3°F | Ht 69.5 in | Wt 146.2 lb

## 2017-05-28 DIAGNOSIS — Z209 Contact with and (suspected) exposure to unspecified communicable disease: Secondary | ICD-10-CM | POA: Diagnosis not present

## 2017-05-28 DIAGNOSIS — Z0000 Encounter for general adult medical examination without abnormal findings: Secondary | ICD-10-CM | POA: Diagnosis not present

## 2017-05-28 MED ORDER — SERTRALINE HCL 50 MG PO TABS: 50.0000 mg | ORAL_TABLET | Freq: Every day | ORAL | 3 refills | Status: AC

## 2017-05-28 NOTE — Progress Notes (Signed)
   Subjective:    Patient ID: Joseph Evans, male    DOB: 1990/06/17, 27 y.o.   MRN: [id number]  HPI Here for a well exam. His only complaint is depression and anxiety. We spoke about this last October and I offered to try him on Zoloft. Unfortunately he never filled this. He met with a therapist a few times and found this helpful, but he stopped because it was too expensive. He now wants to try the medication. He also has a steady girlfriend and he wants to screen for STDs before they become intimate. He has no symptoms of these.    Review of Systems  Constitutional: Negative.   HENT: Negative.   Eyes: Negative.   Respiratory: Negative.   Cardiovascular: Negative.   Gastrointestinal: Negative.   Genitourinary: Negative.   Musculoskeletal: Negative.   Skin: Negative.   Neurological: Negative.   Psychiatric/Behavioral: Positive for decreased concentration and dysphoric mood. Negative for agitation, behavioral problems, confusion, hallucinations, self-injury, sleep disturbance and suicidal ideas. The patient is nervous/anxious.        Objective:   Physical Exam  Constitutional: He is oriented to person, place, and time. He appears well-developed and well-nourished. No distress.  HENT:  Head: Normocephalic and atraumatic.  Right Ear: External ear normal.  Left Ear: External ear normal.  Nose: Nose normal.  Mouth/Throat: Oropharynx is clear and moist. No oropharyngeal exudate.  Eyes: Pupils are equal, round, and reactive to light. Conjunctivae and EOM are normal. Right eye exhibits no discharge. Left eye exhibits no discharge. No scleral icterus.  Neck: Neck supple. No JVD present. No tracheal deviation present. No thyromegaly present.  Cardiovascular: Normal rate, regular rhythm, normal heart sounds and intact distal pulses. Exam reveals no gallop and no friction rub.  No murmur heard. Pulmonary/Chest: Effort normal and breath sounds normal. No respiratory distress. He has no  wheezes. He has no rales. He exhibits no tenderness.  Abdominal: Soft. Bowel sounds are normal. He exhibits no distension and no mass. There is no tenderness. There is no rebound and no guarding.  Genitourinary: Rectum normal, prostate normal and penis normal. Rectal exam shows guaiac negative stool. No penile tenderness.  Musculoskeletal: Normal range of motion. He exhibits no edema or tenderness.  Lymphadenopathy:    He has no cervical adenopathy.  Neurological: He is alert and oriented to person, place, and time. He has normal reflexes. No cranial nerve deficit. He exhibits normal muscle tone. Coordination normal.  Skin: Skin is warm and dry. No rash noted. He is not diaphoretic. No erythema. No pallor.  Psychiatric: He has a normal mood and affect. His behavior is normal. Judgment and thought content normal.          Assessment & Plan:  Well exam. We discussed diet and exercise. He will return soon for fasting labs. We will screen for STDs. He will start on Zoloft 50 mg daily and will follow up in 3-4 weeks. Alysia Penna, MD

## 2017-06-11 ENCOUNTER — Other Ambulatory Visit (INDEPENDENT_AMBULATORY_CARE_PROVIDER_SITE_OTHER): Payer: BLUE CROSS/BLUE SHIELD

## 2017-06-11 DIAGNOSIS — Z0000 Encounter for general adult medical examination without abnormal findings: Secondary | ICD-10-CM | POA: Diagnosis not present

## 2017-06-11 DIAGNOSIS — Z209 Contact with and (suspected) exposure to unspecified communicable disease: Secondary | ICD-10-CM

## 2017-06-11 LAB — CBC WITH DIFFERENTIAL/PLATELET
BASOS PCT: 0.6 % (ref 0.0–3.0)
Basophils Absolute: 0 10*3/uL (ref 0.0–0.1)
Eosinophils Absolute: 0.2 10*3/uL (ref 0.0–0.7)
Eosinophils Relative: 2.8 % (ref 0.0–5.0)
HCT: 42.9 % (ref 39.0–52.0)
Hemoglobin: 14.8 g/dL (ref 13.0–17.0)
LYMPHS ABS: 2.2 10*3/uL (ref 0.7–4.0)
Lymphocytes Relative: 33.8 % (ref 12.0–46.0)
MCHC: 34.5 g/dL (ref 30.0–36.0)
MCV: 86.5 fl (ref 78.0–100.0)
MONOS PCT: 6.2 % (ref 3.0–12.0)
Monocytes Absolute: 0.4 10*3/uL (ref 0.1–1.0)
NEUTROS ABS: 3.7 10*3/uL (ref 1.4–7.7)
NEUTROS PCT: 56.6 % (ref 43.0–77.0)
PLATELETS: 330 10*3/uL (ref 150.0–400.0)
RBC: 4.95 Mil/uL (ref 4.22–5.81)
RDW: 12.7 % (ref 11.5–15.5)
WBC: 6.6 10*3/uL (ref 4.0–10.5)

## 2017-06-11 LAB — HEPATIC FUNCTION PANEL
ALT: 15 U/L (ref 0–53)
AST: 13 U/L (ref 0–37)
Albumin: 4.5 g/dL (ref 3.5–5.2)
Alkaline Phosphatase: 53 U/L (ref 39–117)
BILIRUBIN TOTAL: 0.7 mg/dL (ref 0.2–1.2)
Bilirubin, Direct: 0.1 mg/dL (ref 0.0–0.3)
Total Protein: 7.2 g/dL (ref 6.0–8.3)

## 2017-06-11 LAB — POC URINALSYSI DIPSTICK (AUTOMATED)
Bilirubin, UA: NEGATIVE
Blood, UA: NEGATIVE
Glucose, UA: NEGATIVE
Ketones, UA: NEGATIVE
LEUKOCYTES UA: NEGATIVE
Nitrite, UA: NEGATIVE
PH UA: 7 (ref 5.0–8.0)
Protein, UA: NEGATIVE
Spec Grav, UA: 1.02 (ref 1.010–1.025)
Urobilinogen, UA: 0.2 E.U./dL

## 2017-06-11 LAB — LIPID PANEL
Cholesterol: 175 mg/dL (ref 0–200)
HDL: 59 mg/dL (ref >39.00)
LDL CALC: 106 mg/dL — AB (ref 0–99)
NonHDL: 116.39
TRIGLYCERIDES: 54 mg/dL (ref 0.0–149.0)
Total CHOL/HDL Ratio: 3
VLDL: 10.8 mg/dL (ref 0.0–40.0)

## 2017-06-11 LAB — BASIC METABOLIC PANEL
BUN: 14 mg/dL (ref 6–23)
CALCIUM: 9.5 mg/dL (ref 8.4–10.5)
CO2: 27 mEq/L (ref 19–32)
CREATININE: 0.84 mg/dL (ref 0.40–1.50)
Chloride: 102 mEq/L (ref 96–112)
GFR: 115.96 mL/min (ref >60.00)
Glucose, Bld: 92 mg/dL (ref 70–99)
POTASSIUM: 4.3 meq/L (ref 3.5–5.1)
Sodium: 137 mEq/L (ref 135–145)

## 2017-06-11 LAB — TSH: TSH: 1.32 u[IU]/mL (ref 0.35–4.50)

## 2017-06-12 LAB — HIV ANTIBODY (ROUTINE TESTING W REFLEX): HIV: NONREACTIVE

## 2017-06-12 LAB — C. TRACHOMATIS/N. GONORRHOEAE RNA
C. trachomatis RNA, TMA: NOT DETECTED
N. gonorrhoeae RNA, TMA: NOT DETECTED

## 2017-06-12 LAB — RPR: RPR Ser Ql: NONREACTIVE

## 2017-07-12 ENCOUNTER — Encounter: Payer: Self-pay | Admitting: Family Medicine

## 2017-07-17 ENCOUNTER — Encounter: Payer: Self-pay | Admitting: Family Medicine

## 2017-12-18 DIAGNOSIS — R109 Unspecified abdominal pain: Secondary | ICD-10-CM | POA: Diagnosis not present

## 2017-12-18 DIAGNOSIS — R1032 Left lower quadrant pain: Secondary | ICD-10-CM | POA: Diagnosis not present

## 2018-01-11 DIAGNOSIS — J069 Acute upper respiratory infection, unspecified: Secondary | ICD-10-CM | POA: Diagnosis not present

## 2018-01-28 DIAGNOSIS — J209 Acute bronchitis, unspecified: Secondary | ICD-10-CM | POA: Diagnosis not present

## 2018-02-12 DIAGNOSIS — L709 Acne, unspecified: Secondary | ICD-10-CM | POA: Diagnosis not present

## 2018-02-12 DIAGNOSIS — J45909 Unspecified asthma, uncomplicated: Secondary | ICD-10-CM | POA: Diagnosis not present

## 2018-02-12 DIAGNOSIS — R0981 Nasal congestion: Secondary | ICD-10-CM | POA: Diagnosis not present

## 2018-02-12 DIAGNOSIS — F909 Attention-deficit hyperactivity disorder, unspecified type: Secondary | ICD-10-CM | POA: Diagnosis not present

## 2018-03-17 DIAGNOSIS — F329 Major depressive disorder, single episode, unspecified: Secondary | ICD-10-CM | POA: Diagnosis not present

## 2018-03-17 DIAGNOSIS — J4540 Moderate persistent asthma, uncomplicated: Secondary | ICD-10-CM | POA: Diagnosis not present

## 2018-08-18 ENCOUNTER — Telehealth: Payer: Self-pay | Admitting: Family Medicine

## 2018-08-18 NOTE — Telephone Encounter (Signed)
Copied from [facility] [id number]. Topic: General - Inquiry >> Aug 14, 2018  2:55 PM Virl Axe D wrote: Reason for CRM: Pt would like to have a copy of his immunizations mailed to his home. Please advised.  [address] [place] [address]   Mailed immunizations to pt on 08/18/18

## 2018-08-25 NOTE — Telephone Encounter (Signed)
Patient called and said that the records send to him were for another patient named [employer]. Patient would like a call back to know what to do with them and if his immunization can be send to him because he needs them for school. Please call patient back, thanks.

## 2018-08-25 NOTE — Telephone Encounter (Signed)
Spoke with pt and apologized for error. He states he had not opened envelope as it was addressed to Mr Virl Axe but with his mailing address. I asked pt to open envelope while on the phone with me so we could determine if it perhaps contained his records with the wrong addressee information. He opened envelope and advised the records did have a patient name of [employer]. I asked pt if he could shred these documents and he advised yes, he could. He will do so immediately. Let pt know I will personally mail his immunization records to him today. He voiced understanding of the mix up and thanked [place] for assisting him.

## 2018-10-17 DIAGNOSIS — F39 Unspecified mood [affective] disorder: Secondary | ICD-10-CM | POA: Diagnosis not present

## 2018-11-25 ENCOUNTER — Telehealth: Payer: Self-pay | Admitting: Family Medicine

## 2018-11-25 NOTE — Telephone Encounter (Signed)
Copied from [facility] [id number]. Topic: General - Inquiry >> Aug 14, 2018  2:55 PM Virl Axe D wrote: Reason for CRM: Pt would like to have a copy of his immunizations mailed to his home. Please advised.  [address] [place] [address]

## 2018-12-22 DIAGNOSIS — G5632 Lesion of radial nerve, left upper limb: Secondary | ICD-10-CM | POA: Diagnosis not present

## 2018-12-22 DIAGNOSIS — M25522 Pain in left elbow: Secondary | ICD-10-CM | POA: Diagnosis not present

## 2019-01-15 DIAGNOSIS — G5632 Lesion of radial nerve, left upper limb: Secondary | ICD-10-CM | POA: Diagnosis not present

## 2019-01-15 DIAGNOSIS — M25522 Pain in left elbow: Secondary | ICD-10-CM | POA: Diagnosis not present

## 2019-09-01 ENCOUNTER — Ambulatory Visit: Payer: Self-pay | Admitting: Family Medicine

## 2019-09-01 DIAGNOSIS — Z0289 Encounter for other administrative examinations: Secondary | ICD-10-CM

## 2020-12-26 ENCOUNTER — Other Ambulatory Visit: Payer: Self-pay

## 2020-12-26 ENCOUNTER — Encounter (HOSPITAL_BASED_OUTPATIENT_CLINIC_OR_DEPARTMENT_OTHER): Payer: Self-pay | Admitting: Emergency Medicine

## 2020-12-26 ENCOUNTER — Emergency Department (HOSPITAL_BASED_OUTPATIENT_CLINIC_OR_DEPARTMENT_OTHER): Payer: 59

## 2020-12-26 ENCOUNTER — Emergency Department (HOSPITAL_BASED_OUTPATIENT_CLINIC_OR_DEPARTMENT_OTHER)
Admission: EM | Admit: 2020-12-26 | Discharge: 2020-12-26 | Disposition: A | Discharge status: Home / Self Care | Payer: 59 | Attending: Emergency Medicine | Admitting: Emergency Medicine

## 2020-12-26 DIAGNOSIS — R1032 Left lower quadrant pain: Secondary | ICD-10-CM | POA: Insufficient documentation

## 2020-12-26 DIAGNOSIS — Z87891 Personal history of nicotine dependence: Secondary | ICD-10-CM | POA: Diagnosis not present

## 2020-12-26 DIAGNOSIS — I10 Essential (primary) hypertension: Secondary | ICD-10-CM | POA: Insufficient documentation

## 2020-12-26 DIAGNOSIS — J45909 Unspecified asthma, uncomplicated: Secondary | ICD-10-CM | POA: Diagnosis not present

## 2020-12-26 DIAGNOSIS — R197 Diarrhea, unspecified: Secondary | ICD-10-CM | POA: Insufficient documentation

## 2020-12-26 LAB — CBC
HCT: 43.7 % (ref 39.0–52.0)
Hemoglobin: 14.7 g/dL (ref 13.0–17.0)
MCH: 29.3 pg (ref 26.0–34.0)
MCHC: 33.6 g/dL (ref 30.0–36.0)
MCV: 87.1 fL (ref 80.0–100.0)
Platelets: 333 10*3/uL (ref 150–400)
RBC: 5.02 MIL/uL (ref 4.22–5.81)
RDW: 12.5 % (ref 11.5–15.5)
WBC: 6.2 10*3/uL (ref 4.0–10.5)
nRBC: 0 % (ref 0.0–0.2)

## 2020-12-26 LAB — URINALYSIS, ROUTINE W REFLEX MICROSCOPIC
Bilirubin Urine: NEGATIVE
Glucose, UA: NEGATIVE mg/dL
Hgb urine dipstick: NEGATIVE
Ketones, ur: NEGATIVE mg/dL
Leukocytes,Ua: NEGATIVE
Nitrite: NEGATIVE
Protein, ur: NEGATIVE mg/dL
Specific Gravity, Urine: 1.009 (ref 1.005–1.030)
pH: 6.5 (ref 5.0–8.0)

## 2020-12-26 LAB — COMPREHENSIVE METABOLIC PANEL
ALT: 27 U/L (ref 0–44)
AST: 19 U/L (ref 15–41)
Albumin: 5 g/dL (ref 3.5–5.0)
Alkaline Phosphatase: 51 U/L (ref 38–126)
Anion gap: 10 (ref 5–15)
BUN: 16 mg/dL (ref 6–20)
CO2: 26 mmol/L (ref 22–32)
Calcium: 9.7 mg/dL (ref 8.9–10.3)
Chloride: 103 mmol/L (ref 98–111)
Creatinine, Ser: 0.87 mg/dL (ref 0.61–1.24)
GFR, Estimated: >60 mL/min (ref >60)
Glucose, Bld: 101 mg/dL — ABNORMAL HIGH (ref 70–99)
Potassium: 4.3 mmol/L (ref 3.5–5.1)
Sodium: 139 mmol/L (ref 135–145)
Total Bilirubin: 0.6 mg/dL (ref 0.3–1.2)
Total Protein: 8.1 g/dL (ref 6.5–8.1)

## 2020-12-26 LAB — LIPASE, BLOOD: Lipase: 26 U/L (ref 11–51)

## 2020-12-26 MED ORDER — IOHEXOL 300 MG/ML  SOLN
80.0000 mL | Freq: Once | INTRAMUSCULAR | Status: AC | PRN
  Administered 2020-12-26: 80 mL via INTRAVENOUS

## 2020-12-26 NOTE — ED Notes (Signed)
Patient transported to CT 

## 2020-12-26 NOTE — Discharge Instructions (Addendum)
You were seen in the emergency department today for abdominal/groin pain.  While you were here we did labs which were unremarkable.  We also did a CT of your abdomen which was unremarkable.  It did show that you may have some inflammation of your bowel.  This is not unexpected given that you have irritable bowel syndrome.  I have attached instructions to your discharge paperwork about diets for irritable bowel syndrome.  Please return to emergency department if you have a sudden increase in your abdominal pain, fevers, pain in your penis or testicles or blood in your diarrhea.

## 2020-12-26 NOTE — ED Provider Notes (Signed)
[facility] Provider Note   CSN: [id number] Arrival date & time: 12/26/20  1122     History Chief Complaint  Patient presents with   Groin Pain   Abdominal Pain    Joseph Evans is a 30 y.o. male.  With past medical history of anxiety, ureteral bowel syndrome who presents emergency department with abdominal and groin pain.  He states that last weekend he was getting a couples massage and was moving off of the massage table when he felt to pain in his left groin.  He describes it was achy, intermittent and nonradiating.  He states it feels like a "muscle strain or pull."  He states that when he got off the table he felt a pop.  Over the past week he has had intermittent groin pain specifically with prolonged walking.  He has had no nausea or vomiting, fevers or abdominal pain.  He has had intermittent diarrhea however he has a history of IBS and says it is not abnormal for him.  He denies abdominal or genital trauma, having penile discharge, dysuria or hematuria, scrotal swelling, testicular or scrotal pain.   Groin Pain Associated symptoms include abdominal pain.  Abdominal Pain Associated symptoms: diarrhea   Associated symptoms: no dysuria, no fever, no hematuria, no nausea and no vomiting       Past Medical History:  Diagnosis Date   Allergy    Anxiety    Asthma    resolved as a teenager    Depression     Patient Active Problem List   Diagnosis Date Noted   Depression with anxiety 12/07/2009   HYPERTENSION 12/07/2009   ASTHMA 12/07/2009    Past Surgical History:  Procedure Laterality Date   TYMPANOSTOMY TUBE PLACEMENT  2012   left ear, per Dr. Melida Quitter        Family History  Problem Relation Age of Onset   Heart disease Mother    Hypertension Mother    Mental illness Mother    Hyperlipidemia Mother    Arthritis Mother    Heart disease Father    Hypertension Father    Mental illness Father    Heart disease Maternal  Grandmother    Hypertension Maternal Grandmother    Mental illness Maternal Grandmother    Cancer Maternal Grandfather        prostate   Heart disease Maternal Grandfather    Hypertension Maternal Grandfather    Mental illness Maternal Grandfather    Heart disease Paternal Grandmother    Hypertension Paternal Grandmother    Mental illness Paternal Grandmother    Cancer Paternal Grandfather        prostate   Heart disease Paternal Grandfather    Hypertension Paternal Grandfather    Mental illness Paternal Grandfather    Alcohol abuse Other     Social History   Tobacco Use   Smoking status: Former    Packs/day: 0.50    Years: 12.00    Pack years: 6.00    Types: Cigarettes    Quit date: 03/16/2016    Years since quitting: 4.7   Smokeless tobacco: Former    Types: Chew    Quit date: 03/16/2016   Tobacco comments:    1 or the other  Substance Use Topics   Alcohol use: Yes    Alcohol/week: 28.0 standard drinks    Types: 28 Cans of beer per week    Comment: 4 per day   Drug use: Yes  Types: Marijuana    Home Medications Prior to Admission medications   Medication Sig Start Date End Date Taking? Authorizing Provider  azithromycin (ZITHROMAX) 250 MG tablet As directed 02/14/17   Nelwyn Salisbury, MD  sertraline (ZOLOFT) 50 MG tablet Take 1 tablet (50 mg total) by mouth daily. 05/28/17   Nelwyn Salisbury, MD    Allergies    Patient has no known allergies.  Review of Systems   Review of Systems  Constitutional:  Negative for fever.  Gastrointestinal:  Positive for abdominal pain and diarrhea. Negative for nausea and vomiting.       Left groin pain  Genitourinary:  Negative for difficulty urinating, dysuria, genital sores, hematuria, penile discharge, penile pain, penile swelling, scrotal swelling and testicular pain.  All other systems reviewed and are negative.  Physical Exam Updated Vital Signs BP (!) 161/97 (BP Location: Right Arm)   Pulse 80   Temp 98.8 F (37.1  C) (Oral)   Resp 18   Ht 5\' 10"  (1.778 m)   Wt 72.6 kg   SpO2 100%   BMI 22.96 kg/m   Physical Exam Vitals and nursing note reviewed. Exam conducted with a chaperone present.  Constitutional:      General: He is not in acute distress.    Appearance: Normal appearance. He is well-developed. He is not ill-appearing or toxic-appearing.  HENT:     Head: Normocephalic.  Eyes:     General: No scleral icterus. Cardiovascular:     Rate and Rhythm: Normal rate and regular rhythm.     Pulses: Normal pulses.  Pulmonary:     Effort: Pulmonary effort is normal. No respiratory distress.     Breath sounds: Normal breath sounds.  Abdominal:     General: Abdomen is flat. Bowel sounds are normal. There are no signs of injury.     Palpations: Abdomen is soft.     Tenderness: There is abdominal tenderness in the left lower quadrant. There is no guarding or rebound.     Hernia: No hernia is present.  Genitourinary:    Penis: Normal.      Testes: Normal. Cremasteric reflex is present.        Right: Tenderness or swelling not present.        Left: Tenderness or swelling not present.  Musculoskeletal:        General: Normal range of motion.  Skin:    General: Skin is warm and dry.     Capillary Refill: Capillary refill takes less than 2 seconds.  Neurological:     General: No focal deficit present.     Mental Status: He is alert and oriented to person, place, and time.  Psychiatric:        Mood and Affect: Mood normal.        Behavior: Behavior normal.    ED Results / Procedures / Treatments   Labs (all labs ordered are listed, but only abnormal results are displayed) Labs Reviewed  COMPREHENSIVE METABOLIC PANEL - Abnormal; Notable for the following components:      Result Value   Glucose, Bld 101 (*)    All other components within normal limits  URINALYSIS, ROUTINE W REFLEX MICROSCOPIC - Abnormal; Notable for the following components:   Color, Urine COLORLESS (*)    All other  components within normal limits  LIPASE, BLOOD  CBC   EKG None  Radiology CT ABDOMEN PELVIS W CONTRAST  Result Date: 12/26/2020 CLINICAL DATA:  Abdominal pain, acute, nonlocalized patient  reports left lower quadrant pain EXAM: CT ABDOMEN AND PELVIS WITH CONTRAST TECHNIQUE: Multidetector CT imaging of the abdomen and pelvis was performed using the standard protocol following bolus administration of intravenous contrast. CONTRAST:  69mL OMNIPAQUE IOHEXOL 300 MG/ML  SOLN COMPARISON:  None. FINDINGS: Lower chest: The lung bases are clear. Focal airspace disease or pleural effusion. Normal heart size. Hepatobiliary: Tiny subcentimeter hypodensity in the left hepatic dome, series 2, image 11, too small to characterize but likely cyst. There is no suspicious liver lesion. Gallbladder physiologically distended, no calcified stone. No biliary dilatation. Pancreas: Unremarkable. No pancreatic ductal dilatation or surrounding inflammatory changes. Spleen: Normal in size without focal abnormality. Adrenals/Urinary Tract: Normal adrenal glands. No hydronephrosis or perinephric edema. Homogeneous renal enhancement. No evidence of stone or focal renal lesion. Urinary bladder is unremarkable for degree of distension. Stomach/Bowel: Unremarkable stomach there is mild wall thickening involving short segment loop of small bowel in the left upper quadrant. No associated perienteric fat stranding or inflammation. No bowel pneumatosis or focal mass. Small bowel is otherwise normal. No obstruction. Normal appendix. Sigmoid colon is mildly redundant. There is no colonic wall thickening or inflammatory change. Vascular/Lymphatic: Normal caliber abdominal aorta. Patent portal and mesenteric veins. No abdominopelvic adenopathy. Reproductive: Prostate is unremarkable. Other: No free air, free fluid, or intra-abdominal fluid collection. Small fat containing umbilical hernia. There is no inguinal hernia. Musculoskeletal: There are no  acute or suspicious osseous abnormalities. IMPRESSION: 1. Mild wall thickening involving short segment loop of small bowel in the left upper quadrant, suggesting enteritis. This may be infectious or inflammatory. 2. Small fat containing umbilical hernia. Electronically Signed   By: Keith Rake M.D.   On: 12/26/2020 17:11    Procedures Procedures   Medications Ordered in ED Medications  iohexol (OMNIPAQUE) 300 MG/ML solution 80 mL (80 mLs Intravenous Contrast Given 12/26/20 1652)    ED Course  I have reviewed the triage vital signs and the nursing notes.  Pertinent labs & imaging results that were available during my care of the patient were reviewed by me and considered in my medical decision making (see chart for details).    MDM Rules/Calculators/A&P 30 year old male who presents to the emergency department with groin pain.  Physical exam unremarkable.  Labs unremarkable  CT A/P w/ contrast shows possible enteritis, and given history of IBS, clinically correlates. However, pain is specifically in the left groin. He has no appreciable inguinal hernia on exam to reduce. CT does not demonstrate hernia.  GU exam unremarkable. Testes are in anatomic position. +cremasteric reflex. He has no pain to palpation of the testes or scrotum. There is no erythema or swelling of the penis or scrotum. He is not concerned for STDs. Exam and history not consistent with testicular torsion.  Doubt appendicitis, diverticulitis, pancreatitis, viscous perforation, PUD, SBO, incarcerated/strangulated hernia, cystitis.  Likely musculoskeletal pain. I have instructed him to use ibuprofen/tylenol as needed and gentle stretching. Instructed him to return to the emergency department for sudden onset scrotal pain, nausea or vomiting, fevers.   Stable for discharge Final Clinical Impression(s) / ED Diagnoses Final diagnoses:  Left lower quadrant abdominal pain    Rx / DC Orders ED Discharge Orders      None        Mickie Hillier, PA-C 12/27/20 0005    Sherwood Gambler, MD 12/27/20 [id number]

## 2020-12-26 NOTE — ED Triage Notes (Signed)
L side groin pain x 2 days when he got into bed. States his R testicle hurt some yesterday but no pain to the testicles now. Pt reports mild pain to LLQ.

## 2020-12-26 NOTE — ED Notes (Signed)
Patient verbalizes understanding of discharge instructions. Opportunity for questioning and answers were provided. Patient discharged from ED.  °

## 2021-11-16 DIAGNOSIS — R7301 Impaired fasting glucose: Secondary | ICD-10-CM | POA: Diagnosis not present

## 2021-11-16 DIAGNOSIS — R030 Elevated blood-pressure reading, without diagnosis of hypertension: Secondary | ICD-10-CM | POA: Diagnosis not present

## 2021-11-16 DIAGNOSIS — Z87891 Personal history of nicotine dependence: Secondary | ICD-10-CM | POA: Diagnosis not present

## 2021-11-16 DIAGNOSIS — Z0000 Encounter for general adult medical examination without abnormal findings: Secondary | ICD-10-CM | POA: Diagnosis not present

## 2021-11-16 DIAGNOSIS — J4520 Mild intermittent asthma, uncomplicated: Secondary | ICD-10-CM | POA: Diagnosis not present

## 2021-11-17 DIAGNOSIS — R030 Elevated blood-pressure reading, without diagnosis of hypertension: Secondary | ICD-10-CM | POA: Diagnosis not present

## 2022-06-19 DIAGNOSIS — J0100 Acute maxillary sinusitis, unspecified: Secondary | ICD-10-CM | POA: Diagnosis not present

## 2022-06-28 DIAGNOSIS — J0111 Acute recurrent frontal sinusitis: Secondary | ICD-10-CM | POA: Diagnosis not present

## 2022-06-28 DIAGNOSIS — Z13220 Encounter for screening for lipoid disorders: Secondary | ICD-10-CM | POA: Diagnosis not present

## 2022-06-28 DIAGNOSIS — R030 Elevated blood-pressure reading, without diagnosis of hypertension: Secondary | ICD-10-CM | POA: Diagnosis not present

## 2022-06-28 DIAGNOSIS — F411 Generalized anxiety disorder: Secondary | ICD-10-CM | POA: Diagnosis not present

## 2022-07-12 DIAGNOSIS — R748 Abnormal levels of other serum enzymes: Secondary | ICD-10-CM | POA: Diagnosis not present

## 2022-10-18 DIAGNOSIS — F411 Generalized anxiety disorder: Secondary | ICD-10-CM | POA: Diagnosis not present

## 2022-10-25 DIAGNOSIS — F411 Generalized anxiety disorder: Secondary | ICD-10-CM | POA: Diagnosis not present

## 2022-11-01 DIAGNOSIS — F411 Generalized anxiety disorder: Secondary | ICD-10-CM | POA: Diagnosis not present

## 2022-11-08 DIAGNOSIS — F411 Generalized anxiety disorder: Secondary | ICD-10-CM | POA: Diagnosis not present

## 2022-11-16 DIAGNOSIS — F411 Generalized anxiety disorder: Secondary | ICD-10-CM | POA: Diagnosis not present

## 2022-11-22 DIAGNOSIS — F411 Generalized anxiety disorder: Secondary | ICD-10-CM | POA: Diagnosis not present

## 2022-11-30 DIAGNOSIS — F411 Generalized anxiety disorder: Secondary | ICD-10-CM | POA: Diagnosis not present

## 2022-12-06 DIAGNOSIS — F411 Generalized anxiety disorder: Secondary | ICD-10-CM | POA: Diagnosis not present

## 2022-12-13 DIAGNOSIS — F411 Generalized anxiety disorder: Secondary | ICD-10-CM | POA: Diagnosis not present

## 2022-12-27 DIAGNOSIS — F411 Generalized anxiety disorder: Secondary | ICD-10-CM | POA: Diagnosis not present

## 2023-01-10 DIAGNOSIS — F411 Generalized anxiety disorder: Secondary | ICD-10-CM | POA: Diagnosis not present

## 2023-01-17 DIAGNOSIS — F411 Generalized anxiety disorder: Secondary | ICD-10-CM | POA: Diagnosis not present

## 2023-01-31 DIAGNOSIS — F411 Generalized anxiety disorder: Secondary | ICD-10-CM | POA: Diagnosis not present

## 2023-02-06 DIAGNOSIS — F411 Generalized anxiety disorder: Secondary | ICD-10-CM | POA: Diagnosis not present

## 2023-02-18 DIAGNOSIS — F411 Generalized anxiety disorder: Secondary | ICD-10-CM | POA: Diagnosis not present

## 2023-06-09 IMAGING — CT CT ABD-PELV W/ CM
2 of 4 series · 16 of 46 positions shown, 18 images · IV contrast (APPLIED)
Comparison: None.

CLINICAL DATA: Abdominal pain, acute, nonlocalized patient reports
left lower quadrant pain

EXAM:
CT ABDOMEN AND PELVIS WITH CONTRAST
TECHNIQUE: Multidetector CT imaging of the abdomen and pelvis was performed
using the standard protocol following bolus administration of
intravenous contrast.
CONTRAST:  80mL OMNIPAQUE IOHEXOL 300 MG/ML  SOLN

[Series 2: abd pel w · axial · 0.68mm/px · z∈[-560,-120]mm · 13 of 96 slices shown, 15 images]
[im 4/96  soft-tissue]
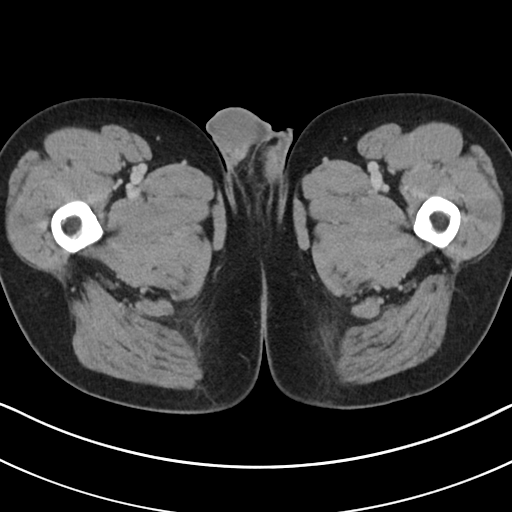
[im 4/96  bone]
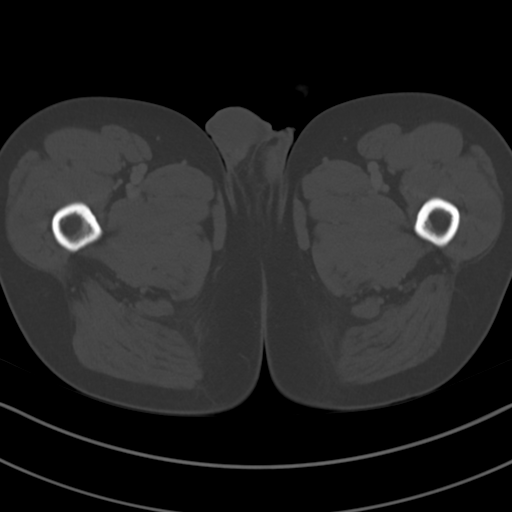
[im 12/96  soft-tissue]
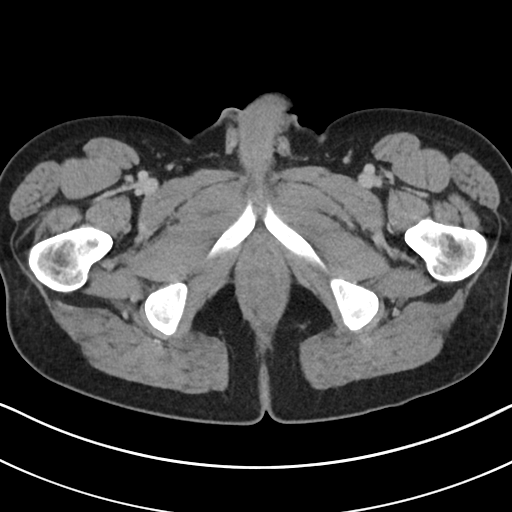
[im 20/96  soft-tissue]
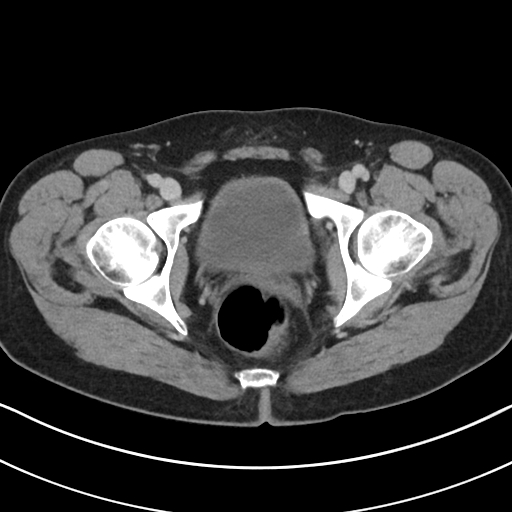
[im 28/96  soft-tissue]
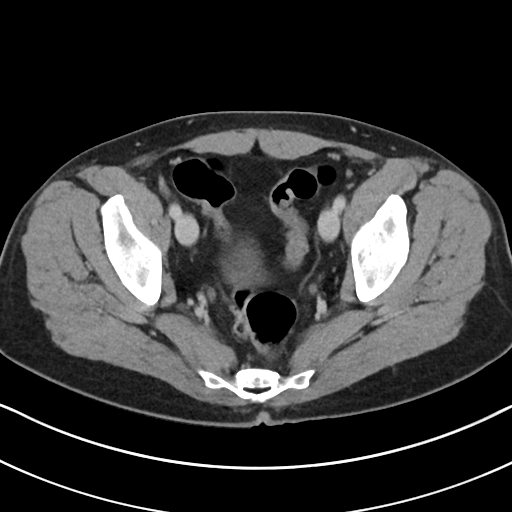
[im 32/96  soft-tissue]
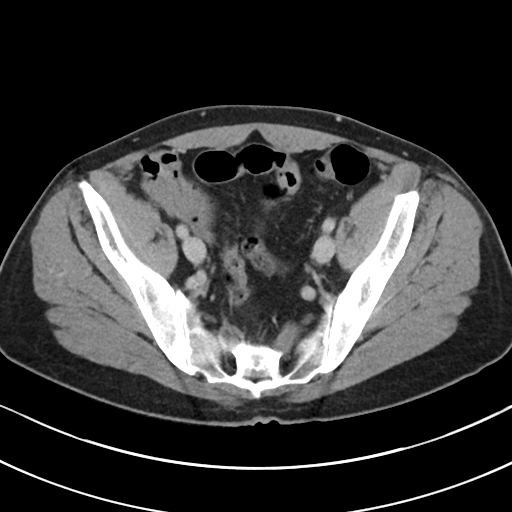
[im 40/96  soft-tissue]
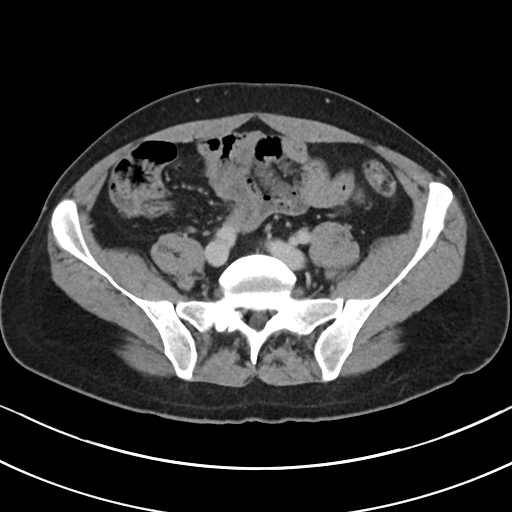
[im 48/96  soft-tissue]
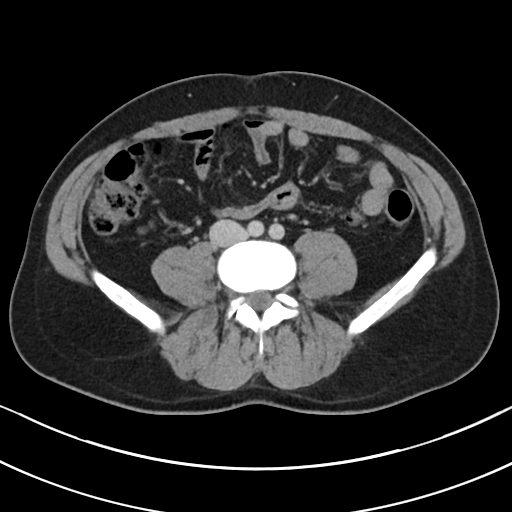
[im 56/96  soft-tissue]
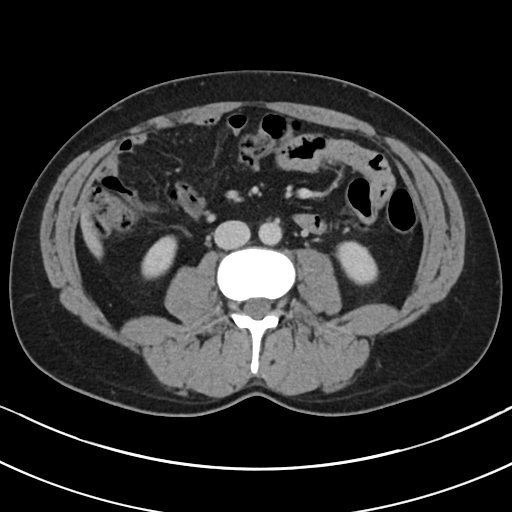
[im 64/96  soft-tissue]
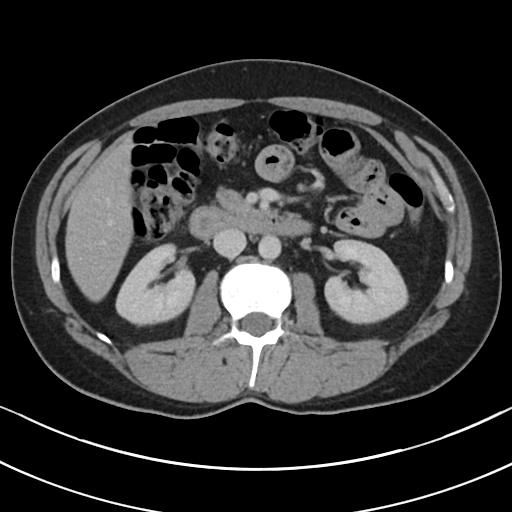
[im 64/96  bone]
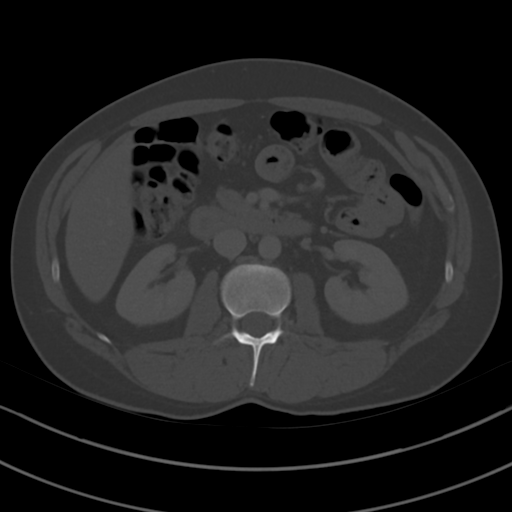
[im 68/96  soft-tissue]
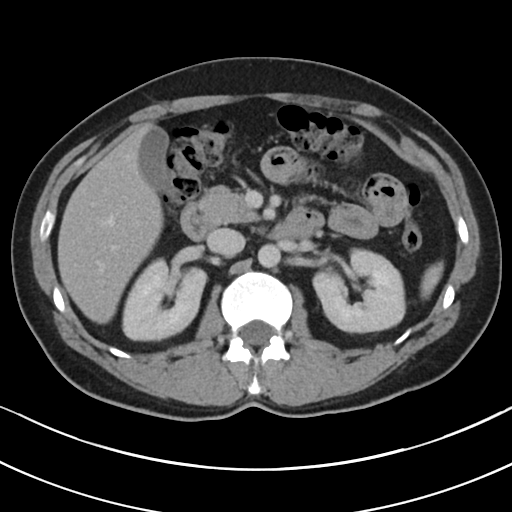
[im 76/96  soft-tissue]
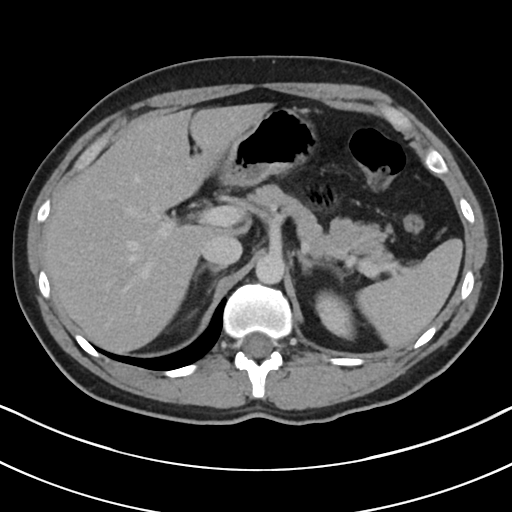
[im 84/96  soft-tissue]
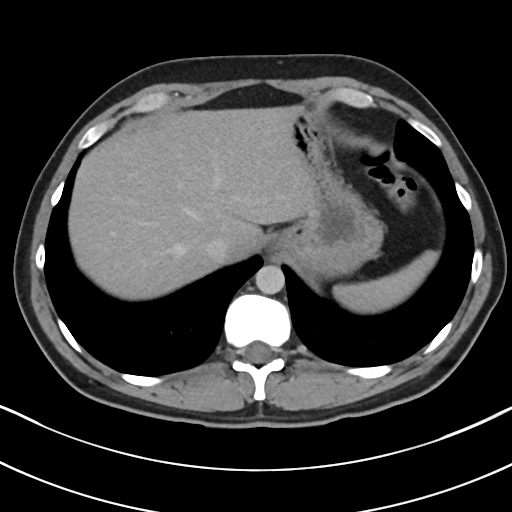
[im 92/96  soft-tissue]
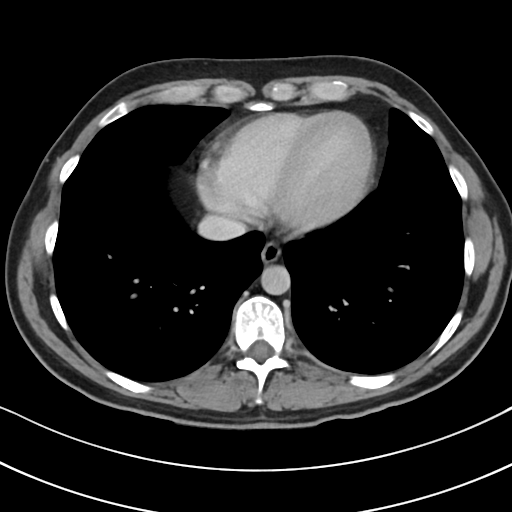

[Series 5: coronal · coronal · 0.74mm/px · 3 of 85 slices shown]
[im 29/85  soft-tissue]
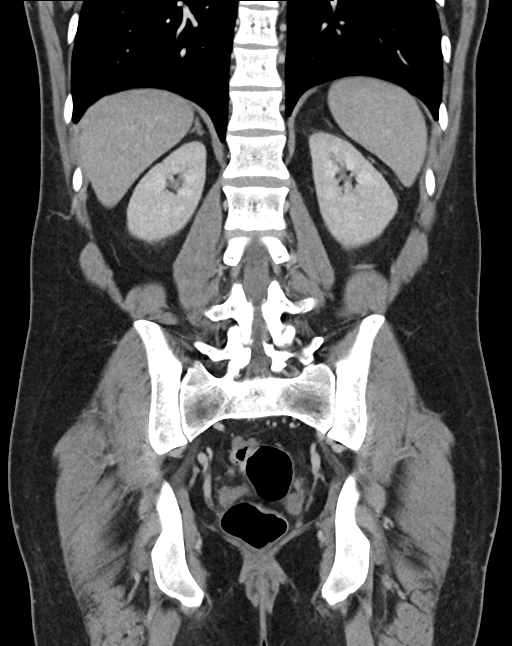
[im 38/85  soft-tissue]
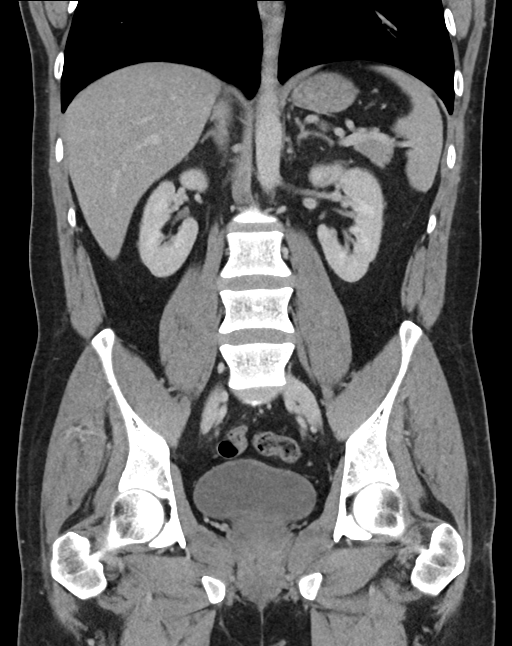
[im 47/85  soft-tissue]
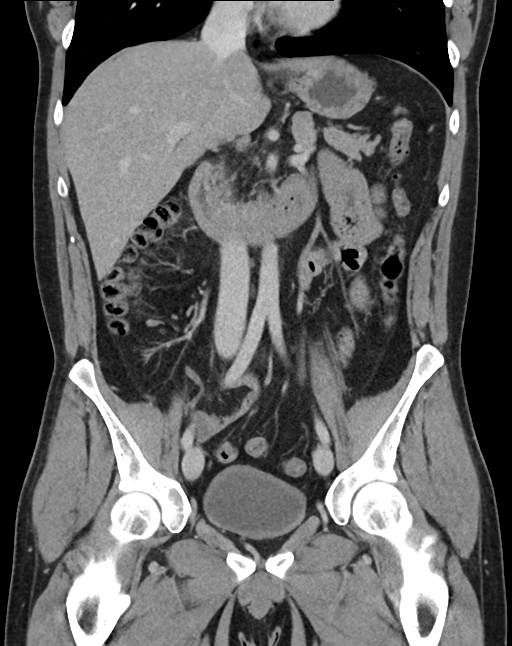

[16 of 46 positions shown; findings below may reference images not displayed]

FINDINGS: Lower chest: The lung bases are clear. Focal airspace disease or
pleural effusion. Normal heart size.

Hepatobiliary: Tiny subcentimeter hypodensity in the left hepatic
dome, series 2, image 11, too small to characterize but likely cyst.
There is no suspicious liver lesion. Gallbladder physiologically
distended, no calcified stone. No biliary dilatation.

Pancreas: Unremarkable. No pancreatic ductal dilatation or
surrounding inflammatory changes.

Spleen: Normal in size without focal abnormality.

Adrenals/Urinary Tract: Normal adrenal glands. No hydronephrosis or
perinephric edema. Homogeneous renal enhancement. No evidence of
stone or focal renal lesion. Urinary bladder is unremarkable for
degree of distension.

Stomach/Bowel: Unremarkable stomach there is mild wall thickening
involving short segment loop of small bowel in the left upper
quadrant. No associated perienteric fat stranding or inflammation.
No bowel pneumatosis or focal mass. Small bowel is otherwise normal.
No obstruction. Normal appendix. Sigmoid colon is mildly redundant.
There is no colonic wall thickening or inflammatory change.

Vascular/Lymphatic: Normal caliber abdominal aorta. Patent portal
and mesenteric veins. No abdominopelvic adenopathy.

Reproductive: Prostate is unremarkable.

Other: No free air, free fluid, or intra-abdominal fluid collection.
Small fat containing umbilical hernia. There is no inguinal hernia.

Musculoskeletal: There are no acute or suspicious osseous
abnormalities.
IMPRESSION: 1. Mild wall thickening involving short segment loop of small bowel
in the left upper quadrant, suggesting enteritis. This may be
infectious or inflammatory.
2. Small fat containing umbilical hernia.
